# Patient Record
Sex: Male | Born: 1955
Health system: Southern US, Community
[De-identification: ages and names within clinical notes are randomized; demographics above are authoritative.]

## PROBLEM LIST (undated history)

## (undated) DIAGNOSIS — E119 Type 2 diabetes mellitus without complications: Secondary | ICD-10-CM

## (undated) HISTORY — DX: Type 2 diabetes mellitus without complications: E11.9

---
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
## 2014-05-03 ENCOUNTER — Telehealth: Payer: Self-pay | Admitting: Family

## 2014-05-03 ENCOUNTER — Encounter: Payer: Self-pay | Admitting: Family

## 2014-05-03 ENCOUNTER — Ambulatory Visit (INDEPENDENT_AMBULATORY_CARE_PROVIDER_SITE_OTHER): Payer: BLUE CROSS/BLUE SHIELD | Admitting: Family

## 2014-05-03 VITALS — BP 160/108 | HR 82 | Temp 97.6°F | Resp 16 | Ht 69.0 in | Wt 211.8 lb

## 2014-05-03 DIAGNOSIS — R635 Abnormal weight gain: Secondary | ICD-10-CM | POA: Insufficient documentation

## 2014-05-03 DIAGNOSIS — R9431 Abnormal electrocardiogram [ECG] [EKG]: Secondary | ICD-10-CM

## 2014-05-03 DIAGNOSIS — Z23 Encounter for immunization: Secondary | ICD-10-CM

## 2014-05-03 DIAGNOSIS — L719 Rosacea, unspecified: Secondary | ICD-10-CM | POA: Insufficient documentation

## 2014-05-03 DIAGNOSIS — Z0000 Encounter for general adult medical examination without abnormal findings: Secondary | ICD-10-CM

## 2014-05-03 DIAGNOSIS — I10 Essential (primary) hypertension: Secondary | ICD-10-CM | POA: Insufficient documentation

## 2014-05-03 LAB — CBC WITH DIFFERENTIAL/PLATELET
BASOS PCT: 0.4 % (ref 0.0–3.0)
Basophils Absolute: 0 10*3/uL (ref 0.0–0.1)
EOS ABS: 0.1 10*3/uL (ref 0.0–0.7)
EOS PCT: 1.7 % (ref 0.0–5.0)
HEMATOCRIT: 44.5 % (ref 39.0–52.0)
HEMOGLOBIN: 15.4 g/dL (ref 13.0–17.0)
Lymphocytes Relative: 23.3 % (ref 12.0–46.0)
Lymphs Abs: 1.7 10*3/uL (ref 0.7–4.0)
MCHC: 34.5 g/dL (ref 30.0–36.0)
MCV: 86.9 fl (ref 78.0–100.0)
MONO ABS: 0.7 10*3/uL (ref 0.1–1.0)
MONOS PCT: 9.5 % (ref 3.0–12.0)
NEUTROS ABS: 4.6 10*3/uL (ref 1.4–7.7)
Neutrophils Relative %: 65.1 % (ref 43.0–77.0)
PLATELETS: 302 10*3/uL (ref 150.0–400.0)
RBC: 5.12 Mil/uL (ref 4.22–5.81)
RDW: 13.6 % (ref 11.5–15.5)
WBC: 7.1 10*3/uL (ref 4.0–10.5)

## 2014-05-03 LAB — URINALYSIS, ROUTINE W REFLEX MICROSCOPIC
Ketones, ur: 15 — AB
LEUKOCYTES UA: NEGATIVE
NITRITE: NEGATIVE
Specific Gravity, Urine: >=1.03 — AB (ref 1.000–1.030)
TOTAL PROTEIN, URINE-UPE24: 100 — AB
UROBILINOGEN UA: 0.2 (ref 0.0–1.0)
Urine Glucose: 250 — AB
pH: 5.5 (ref 5.0–8.0)

## 2014-05-03 LAB — LIPID PANEL
CHOL/HDL RATIO: 4
CHOLESTEROL: 179 mg/dL (ref 0–200)
HDL: 45.5 mg/dL (ref >39.00)
LDL Cholesterol: 112 mg/dL — ABNORMAL HIGH (ref 0–99)
NONHDL: 133.5
Triglycerides: 107 mg/dL (ref 0.0–149.0)
VLDL: 21.4 mg/dL (ref 0.0–40.0)

## 2014-05-03 LAB — HEPATIC FUNCTION PANEL
ALBUMIN: 4.4 g/dL (ref 3.5–5.2)
ALT: 25 U/L (ref 0–53)
AST: 16 U/L (ref 0–37)
Alkaline Phosphatase: 125 U/L — ABNORMAL HIGH (ref 39–117)
BILIRUBIN DIRECT: 0.2 mg/dL (ref 0.0–0.3)
TOTAL PROTEIN: 7 g/dL (ref 6.0–8.3)
Total Bilirubin: 0.9 mg/dL (ref 0.2–1.2)

## 2014-05-03 LAB — BASIC METABOLIC PANEL
BUN: 20 mg/dL (ref 6–23)
CO2: 30 mEq/L (ref 19–32)
Calcium: 9.7 mg/dL (ref 8.4–10.5)
Chloride: 100 mEq/L (ref 96–112)
Creatinine, Ser: 0.95 mg/dL (ref 0.40–1.50)
GFR: 86.54 mL/min (ref >60.00)
Glucose, Bld: 230 mg/dL — ABNORMAL HIGH (ref 70–99)
Potassium: 4.2 mEq/L (ref 3.5–5.1)
SODIUM: 137 meq/L (ref 135–145)

## 2014-05-03 LAB — PSA: PSA: 3.36 ng/mL (ref 0.10–4.00)

## 2014-05-03 LAB — TSH: TSH: 1.35 u[IU]/mL (ref 0.35–4.50)

## 2014-05-03 MED ORDER — METRONIDAZOLE 0.75 % EX GEL: CUTANEOUS | Status: DC

## 2014-05-03 MED ORDER — LISINOPRIL-HYDROCHLOROTHIAZIDE 10-12.5 MG PO TABS: 1.0000 | ORAL_TABLET | Freq: Every day | ORAL | Status: DC

## 2014-05-03 NOTE — Assessment & Plan Note (Signed)
Spoke with Dr. Delton See, cardiologist, nonspecific changes. In setting of shortness of breath with exertion, she recommends stress test. Will order.

## 2014-05-03 NOTE — Assessment & Plan Note (Addendum)
New, trial of metronidazole gel.

## 2014-05-03 NOTE — Progress Notes (Signed)
Subjective:    Patient ID: Paul Yu, male    DOB: 02/06/1956, 59 y.o.   MRN: [id number]  HPI   Paul Yu is a 59 yr old male who presents today to establish care.    Reports that he went to [facility] and noted high reading. Has not had a primary care provider.  Reports some abdominal bloating- only occurs on days when he drinks sodas  Weight gain- reports that over the summer he had lost weight down to 200.  Immunizations: declines flu shot, tetanus Diet: reports diet is fair, eats fiber one bars/fruits. Exercise: no formal exercise, has a treadmill at home Colonoscopy: never   Review of Systems  Constitutional: Positive for unexpected weight change.  HENT: Positive for postnasal drip. Negative for hearing loss.   Eyes: Negative for visual disturbance.  Respiratory: Negative for cough.        Gets winded with activity- started when he gained weight  Cardiovascular: Negative for chest pain.  Gastrointestinal: Negative for diarrhea.       + bloating  Genitourinary: Negative for dysuria and frequency.  Musculoskeletal: Negative for myalgias.       Some joint pain in fingers  Skin:       Some rash on his face.    Neurological: Negative for headaches.  Psychiatric/Behavioral:       Denies depression/anxiety- but notes that he can lose his temper easily.    History reviewed. No pertinent past medical history.  History   Social History  . Marital Status: Married    Spouse Name: N/A    Number of Children: N/A  . Years of Education: N/A   Occupational History  . Not on file.   Social History Main Topics  . Smoking status: Never Smoker   . Smokeless tobacco: Never Used  . Alcohol Use: 0.0 oz/week    0 Not specified per week     Comment: rarely,   . Drug Use: No  . Sexual Activity: Not on file   Other Topics Concern  . Not on file   Social History Narrative   4th marriage-    Daughter born 2002   Son 1982-lives in [place]   Son 1985- in [place]   Drives a truck   Completed 9th grade- GED   Enjoys working on cars,         Past Surgical History  Procedure Laterality Date  . Tonsillectomy  1966    Family History  Problem Relation Age of Onset  . Cancer Mother     ?lung  . Diabetes Maternal Grandmother   . Heart disease Neg Hx     No Known Allergies  No current outpatient prescriptions on file prior to visit.   No current facility-administered medications on file prior to visit.    BP 160/108 mmHg  Pulse 82  Temp(Src) 97.6 F (36.4 C) (Oral)  Resp 16  Ht 5\' 9"  (1.753 m)  Wt 211 lb 12.8 oz (96.072 kg)  BMI 31.26 kg/m2  SpO2 98%        Objective:   Physical Exam  Constitutional: He is oriented to person, place, and time. He appears well-developed and well-nourished. No distress.  HENT:  Head: Normocephalic and atraumatic.  Right Ear: Tympanic membrane and ear canal normal.  Left Ear: Tympanic membrane and ear canal normal.  Eyes: No scleral icterus.  Cardiovascular: Normal rate and regular rhythm.   No murmur heard. Pulmonary/Chest: Effort normal and breath sounds normal. No respiratory distress.  He has no wheezes. He has no rales.  Abdominal: Soft. Bowel sounds are normal. He exhibits no distension. There is no tenderness. There is no rebound and no guarding.  Musculoskeletal: He exhibits no edema.  Lymphadenopathy:    He has no cervical adenopathy.  Neurological: He is alert and oriented to person, place, and time.  Skin: Skin is warm and dry.  + inflamed rash noted on cheeks/nose  Psychiatric: He has a normal mood and affect. His behavior is normal. Thought content normal.          Assessment & Plan:

## 2014-05-03 NOTE — Assessment & Plan Note (Signed)
Discussed healthy diet and weight loss.

## 2014-05-03 NOTE — Progress Notes (Signed)
Pre visit review using our clinic review tool, if applicable. No additional management support is needed unless otherwise documented below in the visit note. 

## 2014-05-03 NOTE — Assessment & Plan Note (Signed)
Tdap to day, fasting labs. PSA, refer for colo.

## 2014-05-03 NOTE — Telephone Encounter (Signed)
noted 

## 2014-05-03 NOTE — Telephone Encounter (Signed)
Please contact pt and let him know that I reviewed his EKG with the cardiologist and there are some non-specific changes.  Since he has some shortness of breath when he is working, I would like him to have a stress test to check his heart.

## 2014-05-03 NOTE — Telephone Encounter (Signed)
Notified pt and he voices understanding and is agreeable to proceed with referral. 

## 2014-05-03 NOTE — Telephone Encounter (Signed)
° °  Pt states he is off on Tues and Thursday for colonoscopy  referral

## 2014-05-03 NOTE — Patient Instructions (Signed)
Start lisinopril-hctz for blood pressure. Start metronidazole gel for rosacea on face. Complete lab work prior to leaving. You will be about contacted re: colonoscopy. Work on low sodium diet, exercise and weight loss. Follow up in 2 weeks.

## 2014-05-03 NOTE — Assessment & Plan Note (Signed)
New. Obtain bmet, start lisinopril hctz.

## 2014-05-05 ENCOUNTER — Telehealth: Payer: Self-pay | Admitting: Family

## 2014-05-05 MED ORDER — METFORMIN HCL 500 MG PO TABS: 500.0000 mg | ORAL_TABLET | Freq: Two times a day (BID) | ORAL | Status: DC

## 2014-05-05 NOTE — Telephone Encounter (Signed)
Spoke with patient and advised per provider recommendations. States understanding of instructions.   Scheduler to put appt in.

## 2014-05-05 NOTE — Telephone Encounter (Signed)
Please contact patient and let him know that his blood work shows that he is diabetic.  I would like him to schedule a 30 minute appointment with me, followed by a 15 minute nurse visit for meter teaching. Start metformin.

## 2014-05-10 ENCOUNTER — Ambulatory Visit: Payer: BLUE CROSS/BLUE SHIELD | Admitting: Family

## 2014-05-12 ENCOUNTER — Ambulatory Visit: Payer: BLUE CROSS/BLUE SHIELD

## 2014-05-12 ENCOUNTER — Ambulatory Visit: Payer: BLUE CROSS/BLUE SHIELD | Admitting: Family

## 2014-05-13 ENCOUNTER — Ambulatory Visit (HOSPITAL_COMMUNITY): Payer: BLUE CROSS/BLUE SHIELD | Attending: Cardiovascular Disease | Admitting: Radiology

## 2014-05-13 DIAGNOSIS — I10 Essential (primary) hypertension: Secondary | ICD-10-CM | POA: Insufficient documentation

## 2014-05-13 DIAGNOSIS — R9431 Abnormal electrocardiogram [ECG] [EKG]: Secondary | ICD-10-CM | POA: Insufficient documentation

## 2014-05-13 DIAGNOSIS — E119 Type 2 diabetes mellitus without complications: Secondary | ICD-10-CM | POA: Diagnosis not present

## 2014-05-13 DIAGNOSIS — R0609 Other forms of dyspnea: Secondary | ICD-10-CM | POA: Diagnosis not present

## 2014-05-13 MED ORDER — TECHNETIUM TC 99M SESTAMIBI GENERIC - CARDIOLITE
30.0000 | Freq: Once | INTRAVENOUS | Status: AC | PRN
  Administered 2014-05-13: 30 via INTRAVENOUS

## 2014-05-13 MED ORDER — TECHNETIUM TC 99M SESTAMIBI GENERIC - CARDIOLITE
10.0000 | Freq: Once | INTRAVENOUS | Status: AC | PRN
  Administered 2014-05-13: 10 via INTRAVENOUS

## 2014-05-13 NOTE — Progress Notes (Signed)
[facility] SITE 3 NUCLEAR MED [address] [place], [place] [address] [phone]    Cardiology Nuclear Med Study  Paul Yu is a 59 y.o. male     MRN : [id number]     DOB: December 25, 1955  Procedure Date: 05/13/2014  Nuclear Med Background Indication for Stress Test:  Evaluation for Ischemia and Abnormal EKG History:  No Cardiac History Cardiac Risk Factors: Hypertension and NIDDM  Symptoms:  DOE   Nuclear Pre-Procedure Caffeine/Decaff Intake:  None NPO After: 8:00pm   Lungs:  clear O2 Sat: 98% on room air. IV 0.9% NS with Angio Cath:  22g  IV Site: R Hand  IV Started by:  Bonnita Levan, RN  Chest Size (in):  44 Cup Size: n/a  Height:  (1.753 m)  Weight:  208 lb (94.348 kg)  BMI:  Body mass index is 30.7 kg/(m^2). Tech Comments:  N/A    Nuclear Med Study 1 or 2 day study: 1 day  Stress Test Type:  Stress  Reading MD: N/A  Order Authorizing Provider:  Danise Edge, MD  Resting Radionuclide: Technetium 25m Sestamibi  Resting Radionuclide Dose: 11.0 mCi   Stress Radionuclide:  Technetium 36m Sestamibi  Stress Radionuclide Dose: 33.0 mCi           Stress Protocol Rest HR: 69 Stress HR: 146  Rest BP: 177/95 Stress BP: 230/110  Exercise Time (min): 6.00 METS: 7.0   Predicted Max HR: 162 bpm % Max HR: 90.12 bpm Rate Pressure Product: 21308   Dose of Adenosine (mg):  n/a Dose of Lexiscan: n/a mg  Dose of Atropine (mg): n/a Dose of Dobutamine: n/a mcg/kg/min (at max HR)  Stress Test Technologist: Bonnita Levan, RN  Nuclear Technologist:  Kerby Nora, CNMT     Rest Procedure:  Myocardial perfusion imaging was performed at rest 45 minutes following the intravenous administration of Technetium 71m Sestamibi. Rest ECG: Normal sinus rhythm. LVH with ST changes  Stress Procedure:  The patient exercised on the treadmill utilizing the Bruce Protocol for 6:00 minutes. The patient stopped due to dyspnea and fatigue and denied any chest pain.  Technetium 34m  Sestamibi was injected at peak exercise and myocardial perfusion imaging was performed after a brief delay. Stress ECG: No significant change from baseline ECG  QPS Raw Data Images:  Normal; no motion artifact; normal heart/lung ratio. Stress Images:  Normal homogeneous uptake in all areas of the myocardium. Rest Images:  Normal homogeneous uptake in all areas of the myocardium. Subtraction (SDS):  No evidence of ischemia. Transient Ischemic Dilatation (Normal <1.22):  1.00 Lung/Heart Ratio (Normal <0.45):  0.32  Quantitative Gated Spect Images QGS EDV:  216 ml QGS ESV:  141 ml  Impression Exercise Capacity:  There is decreased exercise capacity BP Response:  There is a dramatically high blood pressure response, especially considering left ventricular dysfunction. Clinical Symptoms:  Shortness of breath with exercise and fatigue ECG Impression:  No significant ST segment change suggestive of ischemia. Comparison with Prior Nuclear Study: No images to compare  Overall Impression:  The study is abnormal. This is a high risk scan because of the low ejection fraction. There is no definite ischemia. The blood pressure response with exercise is very abnormal. Resting pressure was in the range of 170/90. As the patient stood began to walk pressure went to 176/111. At peak stress blood pressure was 240/129. It would seem that this patient's left ventricular dysfunction could be on the basis of hypertensive disease. Aggressive treatment  of rest and exercise blood pressure may be helpful.  LV Ejection Fraction: 35%.  LV Wall Motion:  There is global hypokinesis.  Jerral Bonito, MD

## 2014-05-17 ENCOUNTER — Encounter: Payer: Self-pay | Admitting: Family

## 2014-05-17 ENCOUNTER — Ambulatory Visit (INDEPENDENT_AMBULATORY_CARE_PROVIDER_SITE_OTHER): Payer: BLUE CROSS/BLUE SHIELD | Admitting: Family

## 2014-05-17 ENCOUNTER — Telehealth: Payer: Self-pay | Admitting: Family

## 2014-05-17 VITALS — BP 172/110 | HR 66 | Temp 98.0°F | Resp 18 | Ht 69.0 in | Wt 215.0 lb

## 2014-05-17 DIAGNOSIS — R9439 Abnormal result of other cardiovascular function study: Secondary | ICD-10-CM

## 2014-05-17 DIAGNOSIS — E119 Type 2 diabetes mellitus without complications: Secondary | ICD-10-CM

## 2014-05-17 DIAGNOSIS — E785 Hyperlipidemia, unspecified: Secondary | ICD-10-CM

## 2014-05-17 DIAGNOSIS — E1165 Type 2 diabetes mellitus with hyperglycemia: Secondary | ICD-10-CM

## 2014-05-17 DIAGNOSIS — IMO0002 Reserved for concepts with insufficient information to code with codable children: Secondary | ICD-10-CM

## 2014-05-17 DIAGNOSIS — I10 Essential (primary) hypertension: Secondary | ICD-10-CM

## 2014-05-17 MED ORDER — CLONIDINE HCL 0.1 MG PO TABS: 0.1000 mg | ORAL_TABLET | Freq: Three times a day (TID) | ORAL | Status: DC

## 2014-05-17 MED ORDER — ONETOUCH DELICA LANCETS FINE MISC: Status: DC

## 2014-05-17 MED ORDER — ASPIRIN EC 81 MG PO TBEC: 81.0000 mg | DELAYED_RELEASE_TABLET | Freq: Every day | ORAL | Status: AC

## 2014-05-17 MED ORDER — ATORVASTATIN CALCIUM 40 MG PO TABS: 40.0000 mg | ORAL_TABLET | Freq: Every day | ORAL | Status: DC

## 2014-05-17 MED ORDER — GLUCOSE BLOOD VI STRP: ORAL_STRIP | Status: DC

## 2014-05-17 NOTE — Telephone Encounter (Signed)
Please contact pt and let him know that I reviewed the stress test again and see that his ejection fraction (the amount of blood that his heart is pumping with each beat) is low. His is 35%, normal is 55-60%.  I would like for him to meet with cardiology for further evaluation and I have pended referral.

## 2014-05-17 NOTE — Patient Instructions (Addendum)
Start aspirin 81mg  and lipitor (atorvastatin) once daily for cardiac prevention.  Continue lisinopril hctz, start clonidine 0.1mg  3 times daily.  You will be contacted about your referral to podiatry. Continue to work on healthy diet (low sodium/diabetic diet), exercise and weight loss. Follow up in 1 week.   Diabetes Mellitus and Food It is important for you to manage your blood sugar (glucose) level. Your blood glucose level can be greatly affected by what you eat. Eating healthier foods in the appropriate amounts throughout the day at about the same time each day will help you control your blood glucose level. It can also help slow or prevent worsening of your diabetes mellitus. Healthy eating may even help you improve the level of your blood pressure and reach or maintain a healthy weight.  HOW CAN FOOD AFFECT ME? Carbohydrates Carbohydrates affect your blood glucose level more than any other type of food. Your dietitian will help you determine how many carbohydrates to eat at each meal and teach you how to count carbohydrates. Counting carbohydrates is important to keep your blood glucose at a healthy level, especially if you are using insulin or taking certain medicines for diabetes mellitus. Alcohol Alcohol can cause sudden decreases in blood glucose (hypoglycemia), especially if you use insulin or take certain medicines for diabetes mellitus. Hypoglycemia can be a life-threatening condition. Symptoms of hypoglycemia (sleepiness, dizziness, and disorientation) are similar to symptoms of having too much alcohol.  If your health care provider has given you approval to drink alcohol, do so in moderation and use the following guidelines:  Women should not have more than one drink per day, and men should not have more than two drinks per day. One drink is equal to:  12 oz of beer.  5 oz of wine.  1 oz of hard liquor.  Do not drink on an empty stomach.  Keep yourself hydrated. Have water,  diet soda, or unsweetened iced tea.  Regular soda, juice, and other mixers might contain a lot of carbohydrates and should be counted. WHAT FOODS ARE NOT RECOMMENDED? As you make food choices, it is important to remember that all foods are not the same. Some foods have fewer nutrients per serving than other foods, even though they might have the same number of calories or carbohydrates. It is difficult to get your body what it needs when you eat foods with fewer nutrients. Examples of foods that you should avoid that are high in calories and carbohydrates but low in nutrients include:  Trans fats (most processed foods list trans fats on the Nutrition Facts label).  Regular soda.  Juice.  Candy.  Sweets, such as cake, pie, doughnuts, and cookies.  Fried foods. WHAT FOODS CAN I EAT? Have nutrient-rich foods, which will nourish your body and keep you healthy. The food you should eat also will depend on several factors, including:  The calories you need.  The medicines you take.  Your weight.  Your blood glucose level.  Your blood pressure level.  Your cholesterol level. You also should eat a variety of foods, including:  Protein, such as meat, poultry, fish, tofu, nuts, and seeds (lean animal proteins are best).  Fruits.  Vegetables.  Dairy products, such as milk, cheese, and yogurt (low fat is best).  Breads, grains, pasta, cereal, rice, and beans.  Fats such as olive oil, trans fat-free margarine, canola oil, avocado, and olives. DOES EVERYONE WITH DIABETES MELLITUS HAVE THE SAME MEAL PLAN? Because every person with diabetes mellitus is  different, there is not one meal plan that works for everyone. It is very important that you meet with a dietitian who will help you create a meal plan that is just right for you. Document Released: 12/06/2004 Document Revised: 03/16/2013 Document Reviewed: 02/05/2013 [facility] Patient Information 2015 [place], [place]. This information is  not intended to replace advice given to you by your health care provider. Make sure you discuss any questions you have with your health care provider.

## 2014-05-17 NOTE — Progress Notes (Signed)
Subjective:    Patient ID: Paul Yu, male    DOB: May 24, 1955, 59 y.o.   MRN: [id number]  HPI  Patient here to discuss new diagnosis of DM2.  Last visit he was found to have sugar of 230 on routine lab draw.    The patient has eliminated sweet tea and soda. He will schedule eye exam.  No side effects from metformin.  HTN- He reports + compliance with bp med.   BP Readings from Last 3 Encounters:  05/17/14 170/106  05/13/14 177/95  05/03/14 160/108     Review of Systems See HPI   Past Medical History  Diagnosis Date  . Diabetes mellitus without complication     History   Social History  . Marital Status: Married    Spouse Name: N/A  . Number of Children: N/A  . Years of Education: N/A   Occupational History  . Not on file.   Social History Main Topics  . Smoking status: Never Smoker   . Smokeless tobacco: Never Used  . Alcohol Use: 0.0 oz/week    0 Standard drinks or equivalent per week     Comment: rarely,   . Drug Use: No  . Sexual Activity: Not on file   Other Topics Concern  . 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 Tonsillectomy  1966    Family History  Problem Relation Age of Onset  . Cancer Mother     ?lung  . Diabetes Maternal Grandmother   . Heart disease Neg Hx     No Known Allergies  Current Outpatient Prescriptions on File Prior to Visit  Medication Sig Dispense Refill  . metFORMIN (GLUCOPHAGE) 500 MG tablet Take 1 tablet (500 mg total) by mouth 2 (two) times daily with a meal. 60 tablet 2  . metroNIDAZOLE (METROGEL) 0.75 % gel Apply once daily to face 45 g 2   No current facility-administered medications on file prior to visit.    BP 172/110 mmHg  Pulse 66  Temp(Src) 98 F (36.7 C) (Oral)  Resp  18  Ht 5\' 9"  (1.753 m)  Wt 215 lb (97.523 kg)  BMI 31.74 kg/m2  SpO2 99%       Objective:    Physical Exam  Constitutional: He is oriented to person, place, and time. He appears well-developed and well-nourished. No distress.  HENT:  Head: Normocephalic and atraumatic.  Cardiovascular: Normal rate and regular rhythm.   No murmur heard. Pulmonary/Chest: Effort normal and breath sounds normal. No respiratory distress. He has no wheezes. He has no rales.  Musculoskeletal: He exhibits no edema.  Neurological: He is alert and oriented to person, place, and time.  Skin: Skin is warm and dry.  Psychiatric: He has a normal mood and affect. His behavior is normal. Thought content normal.    BP 170/106 mmHg  Pulse 66  Temp(Src) 98 F (36.7 C) (Oral)  Resp 18  Ht 5\' 9"  (1.753 m)  Wt 215 lb (97.523 kg)  BMI 31.74 kg/m2  SpO2 99% Wt Readings from Last 3 Encounters:  05/17/14 215 lb (97.523 kg)  05/13/14 208 lb (94.348 kg)  05/03/14 211 lb 12.8 oz (96.072  kg)     Lab Results  Component Value Date   WBC 7.1 05/03/2014   HGB 15.4 05/03/2014   HCT 44.5 05/03/2014   PLT 302.0 05/03/2014   GLUCOSE 230* 05/03/2014   CHOL 179 05/03/2014   TRIG 107.0 05/03/2014   HDL 45.50 05/03/2014   LDLCALC 112* 05/03/2014   ALT 25 05/03/2014   AST 16 05/03/2014   NA 137 05/03/2014   K 4.2 05/03/2014   CL 100 05/03/2014   CREATININE 0.95 05/03/2014   BUN 20 05/03/2014   CO2 30 05/03/2014   TSH 1.35 05/03/2014   PSA 3.36 05/03/2014       Assessment & Plan:

## 2014-05-17 NOTE — Progress Notes (Signed)
Pt education provided on how to utilize the OneTouch Verio glucose meter.  Demonstration provided.  Pt was able to successfully provide a return demonstration with little coaching.  Rx sent to pharmacy for additional strips and lancets.  Pt is aware.

## 2014-05-18 DIAGNOSIS — E785 Hyperlipidemia, unspecified: Secondary | ICD-10-CM | POA: Insufficient documentation

## 2014-05-18 DIAGNOSIS — E119 Type 2 diabetes mellitus without complications: Secondary | ICD-10-CM | POA: Insufficient documentation

## 2014-05-18 DIAGNOSIS — R9439 Abnormal result of other cardiovascular function study: Secondary | ICD-10-CM | POA: Insufficient documentation

## 2014-05-18 NOTE — Telephone Encounter (Signed)
Notified pt and he is agreeable to proceed with referral. 

## 2014-05-18 NOTE — Assessment & Plan Note (Signed)
Uncontrolled. Continue zestoretic, add clonidine.  A dose was given in the office and BP improved.

## 2014-05-18 NOTE — Assessment & Plan Note (Signed)
Lab Results  Component Value Date   CHOL 179 05/03/2014   HDL 45.50 05/03/2014   LDLCALC 112* 05/03/2014   TRIG 107.0 05/03/2014   CHOLHDL 4 05/03/2014   LDL above goal. Add statin for cardiac prevention.

## 2014-05-18 NOTE — Assessment & Plan Note (Addendum)
Addendum:  Noted to decreased LVEF (35%) on myoview.  Will refer to cardiology for further evaluation. See phone note.

## 2014-05-18 NOTE — Assessment & Plan Note (Signed)
New.  Discussed diabetic diet, exercise, weight loss.  Now on metformin, baby aspirin for cardiac prevention. Obtain A1C,urine microalbumin.  Needs pneumovax.

## 2014-05-24 ENCOUNTER — Encounter: Payer: Self-pay | Admitting: Family

## 2014-05-24 ENCOUNTER — Ambulatory Visit (INDEPENDENT_AMBULATORY_CARE_PROVIDER_SITE_OTHER): Payer: BLUE CROSS/BLUE SHIELD | Admitting: Family

## 2014-05-24 VITALS — BP 160/94 | HR 66 | Temp 97.6°F | Resp 16 | Ht 69.0 in | Wt 213.4 lb

## 2014-05-24 DIAGNOSIS — I10 Essential (primary) hypertension: Secondary | ICD-10-CM

## 2014-05-24 DIAGNOSIS — R9439 Abnormal result of other cardiovascular function study: Secondary | ICD-10-CM

## 2014-05-24 DIAGNOSIS — E1165 Type 2 diabetes mellitus with hyperglycemia: Secondary | ICD-10-CM

## 2014-05-24 DIAGNOSIS — IMO0002 Reserved for concepts with insufficient information to code with codable children: Secondary | ICD-10-CM

## 2014-05-24 DIAGNOSIS — E785 Hyperlipidemia, unspecified: Secondary | ICD-10-CM

## 2014-05-24 DIAGNOSIS — E119 Type 2 diabetes mellitus without complications: Secondary | ICD-10-CM

## 2014-05-24 LAB — BASIC METABOLIC PANEL
BUN: 15 mg/dL (ref 6–23)
CALCIUM: 9 mg/dL (ref 8.4–10.5)
CO2: 30 meq/L (ref 19–32)
CREATININE: 0.91 mg/dL (ref 0.40–1.50)
Chloride: 101 mEq/L (ref 96–112)
GFR: 90.92 mL/min (ref >60.00)
Glucose, Bld: 178 mg/dL — ABNORMAL HIGH (ref 70–99)
Potassium: 4.2 mEq/L (ref 3.5–5.1)
Sodium: 137 mEq/L (ref 135–145)

## 2014-05-24 LAB — MICROALBUMIN / CREATININE URINE RATIO
Creatinine,U: 249.4 mg/dL
MICROALB/CREAT RATIO: 5.5 mg/g (ref 0.0–30.0)
Microalb, Ur: 13.6 mg/dL — ABNORMAL HIGH (ref 0.0–1.9)

## 2014-05-24 LAB — HEMOGLOBIN A1C: HEMOGLOBIN A1C: 9.3 % — AB (ref 4.6–6.5)

## 2014-05-24 MED ORDER — LISINOPRIL-HYDROCHLOROTHIAZIDE 20-25 MG PO TABS: 1.0000 | ORAL_TABLET | Freq: Every day | ORAL | Status: DC

## 2014-05-24 NOTE — Patient Instructions (Signed)
Complete lab work prior to leaving. Continue clonidine 3x daily. Increase lisinopril hctz to 20/25mg  once daily. Continue to work on [employer], exercise, weight loss. Please consider starting atorvastatin and aspirin to decrease your risk of heart attack and stroke.  Follow up in 1 month.

## 2014-05-24 NOTE — Progress Notes (Signed)
Pre visit review using our clinic review tool, if applicable. No additional management support is needed unless otherwise documented below in the visit note. 

## 2014-05-24 NOTE — Assessment & Plan Note (Signed)
Cardiology consult is pending.

## 2014-05-24 NOTE — Progress Notes (Signed)
Subjective:    Patient ID: Paul Yu, male    DOB: 11-28-1955, 59 y.o.   MRN: [id number]  HPI  [name] is a 59 yr old male who presents today for follow up of his blood pressure.  Last visit clonidine was added to his regimen.  Trying to make improvements to his diet.  He is continued on lisinopril-hctz.   BP Readings from Last 3 Encounters:  05/24/14 160/94  05/17/14 172/110  05/13/14 177/95   DM2- reports mild diarrhea initially with metformin but this has improved.  He reports fasting sugars around 150.    He is not currently taking statin or aspirin, concerned about possible side effects  Has upcoming cardiology visit to assess decreased LVEF noted on myoview.  Review of Systems See HPI  Past Medical History  Diagnosis Date  . Diabetes mellitus without complication     History   Social History  . Marital Status: Married    Spouse Name: N/A  . Number of Children: N/A  . Years of Education: N/A   Occupational History  . Not on file.   Social History Main Topics  . Smoking status: Never Smoker   . Smokeless tobacco: Never Used  . Alcohol Use: 0.0 oz/week    0 Standard drinks or equivalent per week     Comment: rarely,   . Drug Use: No  . Sexual Activity: Not on file   Other Topics Concern  . 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 Tonsillectomy  1966    Family History  Problem Relation Age of Onset  . Cancer Mother     ?lung  . Diabetes Maternal Grandmother   . Heart disease Neg Hx     No Known Allergies  Current Outpatient Prescriptions on File Prior to Visit  Medication Sig Dispense Refill  . cloNIDine (CATAPRES) 0.1 MG tablet Take 1 tablet (0.1 mg total) by mouth 3 (three) times daily. 90 tablet 0  . glucose  blood (ONETOUCH VERIO) test strip Check blood sugar every day and as needed. 100 each 12  . lisinopril-hydrochlorothiazide (PRINZIDE,ZESTORETIC) 10-12.5 MG per tablet Take 1 tablet by mouth daily.    . metFORMIN (GLUCOPHAGE) 500 MG tablet Take 1 tablet (500 mg total) by mouth 2 (two) times daily with a meal. 60 tablet 2  . metroNIDAZOLE (METROGEL) 0.75 % gel Apply once daily to face 45 g 2  . ONETOUCH DELICA LANCETS FINE MISC Check blood sugar every day and as needed 100 each 12  . aspirin EC 81 MG tablet Take 1 tablet (81 mg total) by mouth daily. (Patient not taking: Reported on 05/24/2014)    . atorvastatin (LIPITOR) 40 MG tablet Take 1 tablet (40 mg total) by mouth daily. (Patient not taking: Reported on 05/24/2014) 30 tablet 2   No current facility-administered medications on file prior to visit.    BP 160/94 mmHg  Pulse 66  Temp(Src) 97.6 F (36.4 C) (Oral)  Resp 16  Ht 5\' 9"  (1.753 m)  Wt 213 lb 6.4 oz (96.798 kg)  BMI 31.50 kg/m2  SpO2 99%       Objective:   Physical Exam  Constitutional:  He is oriented to person, place, and time. He appears well-developed and well-nourished. No distress.  HENT:  Head: Normocephalic and atraumatic.  Cardiovascular: Normal rate and regular rhythm.   No murmur heard. Pulmonary/Chest: Effort normal and breath sounds normal. No respiratory distress. He has no wheezes. He has no rales.  Musculoskeletal: He exhibits no edema.  Neurological: He is alert and oriented to person, place, and time.  Skin: Skin is warm and dry.  Psychiatric: He has a normal mood and affect. His behavior is normal. Thought content normal.          Assessment & Plan:

## 2014-05-24 NOTE — Assessment & Plan Note (Signed)
Improving but still above goal. Continue clonidine, increase lisinopril hctz from 10-12.5 to 20- 25.

## 2014-05-24 NOTE — Assessment & Plan Note (Signed)
He is improving diet.  I encouraged him to continue metformin, dietary changes, exercise, weight loss efforts. Complete lab work today as ordered last visit.

## 2014-05-24 NOTE — Assessment & Plan Note (Signed)
We discussed his calculated CVD risk over next 10 years (20%). Advised pt that benefits of aspirin/statin outweigh risks in his case.  Encouraged him to begin these meds.

## 2014-05-25 ENCOUNTER — Telehealth: Payer: Self-pay | Admitting: *Deleted

## 2014-05-25 NOTE — Telephone Encounter (Signed)
Prior authorization for Verio test strips initiated. Awaiting determination. JG//CMA 

## 2014-05-27 ENCOUNTER — Encounter: Payer: Self-pay | Admitting: Family

## 2014-06-08 NOTE — Telephone Encounter (Signed)
Do we have any of the glucose meter samples listed below.  If so, please send rx for matching strips test once daily #100 with 23 refills.

## 2014-06-08 NOTE — Telephone Encounter (Signed)
PA denied. Covered alternatives are Accu-Chek (Aviva, Aviva Plus, Compact, [employer], [address] and [employer] (Contour, Contour Next, [place] [address]). Please advise. JG//CMA

## 2014-06-09 MED ORDER — ACCU-CHEK AVIVA PLUS W/DEVICE KIT: PACK | Status: DC

## 2014-06-09 MED ORDER — GLUCOSE BLOOD VI STRP: ORAL_STRIP | Status: DC

## 2014-06-09 NOTE — Addendum Note (Signed)
Addended by: Amado Coe A on: 06/09/2014 10:39 AM   Modules accepted: Orders, Medications

## 2014-06-09 NOTE — Telephone Encounter (Signed)
Could you pls send rx for accucheck Aviva and strip to pharmacy?

## 2014-06-09 NOTE — Telephone Encounter (Signed)
accuchek aviva kit and strips escribed to pharmacy. JG//CMA

## 2014-06-09 NOTE — Telephone Encounter (Signed)
The only ones we have here is One Touch and Verio which are not covered.

## 2014-06-21 ENCOUNTER — Other Ambulatory Visit: Payer: Self-pay | Admitting: Family

## 2014-06-21 MED ORDER — CLONIDINE HCL 0.1 MG PO TABS: 0.1000 mg | ORAL_TABLET | Freq: Three times a day (TID) | ORAL | Status: DC

## 2014-06-21 NOTE — Telephone Encounter (Signed)
Called the patient informed sent in a 30 day supply for clonidine #90 to [facility] .

## 2014-06-21 NOTE — Telephone Encounter (Signed)
Caller name:Gemma, Greggory Stallion Relation to EH:UDJS Call back number:[phone] Pharmacy:[facility]        Reason for call: pt states is is completely out of his bp meds rx states Melissa gave him a 90 day supply however he takes 3 times daily and she did not provide any refills.  cloNIDine (CATAPRES) 0.1 MG tablet

## 2014-06-28 ENCOUNTER — Ambulatory Visit (INDEPENDENT_AMBULATORY_CARE_PROVIDER_SITE_OTHER): Payer: BLUE CROSS/BLUE SHIELD | Admitting: Family

## 2014-06-28 ENCOUNTER — Encounter: Payer: Self-pay | Admitting: Family

## 2014-06-28 VITALS — BP 148/98 | HR 71 | Temp 97.4°F | Resp 16 | Ht 69.0 in | Wt 209.0 lb

## 2014-06-28 DIAGNOSIS — E119 Type 2 diabetes mellitus without complications: Secondary | ICD-10-CM

## 2014-06-28 DIAGNOSIS — I10 Essential (primary) hypertension: Secondary | ICD-10-CM

## 2014-06-28 DIAGNOSIS — E785 Hyperlipidemia, unspecified: Secondary | ICD-10-CM

## 2014-06-28 LAB — BASIC METABOLIC PANEL
BUN: 19 mg/dL (ref 6–23)
CALCIUM: 9.7 mg/dL (ref 8.4–10.5)
CO2: 31 mEq/L (ref 19–32)
Chloride: 98 mEq/L (ref 96–112)
Creatinine, Ser: 0.94 mg/dL (ref 0.40–1.50)
GFR: 87.55 mL/min (ref >60.00)
GLUCOSE: 189 mg/dL — AB (ref 70–99)
Potassium: 3.7 mEq/L (ref 3.5–5.1)
Sodium: 135 mEq/L (ref 135–145)

## 2014-06-28 MED ORDER — AMLODIPINE BESYLATE 5 MG PO TABS: 5.0000 mg | ORAL_TABLET | Freq: Every day | ORAL | Status: DC

## 2014-06-28 MED ORDER — METFORMIN HCL 1000 MG PO TABS: 1000.0000 mg | ORAL_TABLET | Freq: Two times a day (BID) | ORAL | Status: DC

## 2014-06-28 NOTE — Progress Notes (Signed)
Pre visit review using our clinic review tool, if applicable. No additional management support is needed unless otherwise documented below in the visit note. 

## 2014-06-28 NOTE — Patient Instructions (Addendum)
Please complete lab work prior to leaving.  Increase metformin to 1000mg  twice daily.  Add amlodipine 5mg  once daily for blood pressure.  Follow up in 2 weeks for a nurse visit bp check and mid June for office visit.

## 2014-06-28 NOTE — Assessment & Plan Note (Signed)
BP improved but still above goal. Add amlodipine, continue lisinopril/hctz

## 2014-06-28 NOTE — Progress Notes (Signed)
Subjective:    Patient ID: Paul Yu, male    DOB: [birth year], 59 y.o.   MRN: [id number]  HPI    HTN- Patient is currently maintained on the following medications for blood pressure: lisinopril/hctz Patient reports good compliance with blood pressure medications. Patient denies chest pain, shortness of breath or swelling. Last 3 blood pressure readings in our office are as follows:  BP Readings from Last 3 Encounters:  06/28/14 148/98  05/24/14 160/94  05/17/14 172/110    DM2- has cut back on sweets. On metformin. Lab Results  Component Value Date   HGBA1C 9.3* 05/24/2014   Reports sugars 150-225 mid day.  He does not check fasting.      Patient is currently maintained on the following medication for hyperlipidemia: atorvastatin, reports + compliance Last lipid panel as follows:  Lab Results  Component Value Date   CHOL 179 05/03/2014   HDL 45.50 05/03/2014   LDLCALC 112* 05/03/2014   TRIG 107.0 05/03/2014   CHOLHDL 4 05/03/2014    Patient denies myalgia.   Review of Systems    see HPI  Past Medical History  Diagnosis Date  . Diabetes mellitus without complication     History   Social History  . Marital Status: Married    Spouse Name: N/A  . Number of Children: N/A  . Years of Education: N/A   Occupational History  . Not on file.   Social History Main Topics  . Smoking status: Never Smoker   . Smokeless tobacco: Never Used  . Alcohol Use: 0.0 oz/week    0 Standard drinks or equivalent per week     Comment: rarely,   . Drug Use: No  . Sexual Activity: Not on file   Other Topics Concern  . Not on file   Social History Narrative   4th marriage-    Daughter born 2002   Son 1982-lives in [place]- in [place] a truck   Completed 9th grade- GED   Enjoys working on cars,         Past Surgical History  Procedure Laterality Date  . Tonsillectomy  1966    Family History  Problem Relation Age of Onset  .  Cancer Mother     ?lung  . Diabetes Maternal Grandmother   . Heart disease Neg Hx     No Known Allergies  Current Outpatient Prescriptions on File Prior to Visit  Medication Sig Dispense Refill  . aspirin EC 81 MG tablet Take 1 tablet (81 mg total) by mouth daily.    Marland Kitchen atorvastatin (LIPITOR) 40 MG tablet Take 1 tablet (40 mg total) by mouth daily. 30 tablet 2  . Blood Glucose Monitoring Suppl (ACCU-CHEK AVIVA PLUS) W/DEVICE KIT Test blood sugar once daily. Dx Code: E11.9 1 kit 1  . cloNIDine (CATAPRES) 0.1 MG tablet Take 1 tablet (0.1 mg total) by mouth 3 (three) times daily. 90 tablet 0  . glucose blood (ACCU-CHEK AVIVA PLUS) test strip Test blood sugar once daily. Dx Code: E11.9 100 each 23  . lisinopril-hydrochlorothiazide (PRINZIDE,ZESTORETIC) 20-25 MG per tablet Take 1 tablet by mouth daily. 30 tablet 1  . metroNIDAZOLE (METROGEL) 0.75 % gel Apply once daily to face 45 g 2   No current facility-administered medications on file prior to visit.    BP 148/98 mmHg  Pulse 71  Temp(Src) 97.4 F (36.3 C) (Oral)  Resp 16  Ht 5' 9"  (1.753 m)  Wt 209 lb (94.802  kg)  BMI 30.85 kg/m2  SpO2 99%    Objective:   Physical Exam  Constitutional: He is oriented to person, place, and time. He appears well-developed and well-nourished. No distress.  HENT:  Head: Normocephalic and atraumatic.  Cardiovascular: Normal rate and regular rhythm.   No murmur heard. Pulmonary/Chest: Effort normal and breath sounds normal. No respiratory distress. He has no wheezes. He has no rales.  Musculoskeletal: He exhibits no edema.  Neurological: He is alert and oriented to person, place, and time.  Skin: Skin is warm and dry.  Psychiatric: He has a normal mood and affect. His behavior is normal. Thought content normal.          Assessment & Plan:

## 2014-06-28 NOTE — Assessment & Plan Note (Addendum)
Continue statin, plan flp next visit.

## 2014-06-28 NOTE — Assessment & Plan Note (Signed)
Based on home readings, will increase metformin from 500mg  bid to 1000mg  bid.

## 2014-07-05 ENCOUNTER — Ambulatory Visit (INDEPENDENT_AMBULATORY_CARE_PROVIDER_SITE_OTHER): Payer: BLUE CROSS/BLUE SHIELD | Admitting: Cardiology

## 2014-07-05 ENCOUNTER — Telehealth: Payer: Self-pay | Admitting: *Deleted

## 2014-07-05 ENCOUNTER — Encounter: Payer: Self-pay | Admitting: Cardiology

## 2014-07-05 VITALS — BP 150/90 | HR 62 | Ht 70.0 in | Wt 210.8 lb

## 2014-07-05 DIAGNOSIS — R9439 Abnormal result of other cardiovascular function study: Secondary | ICD-10-CM

## 2014-07-05 DIAGNOSIS — Z01812 Encounter for preprocedural laboratory examination: Secondary | ICD-10-CM

## 2014-07-05 DIAGNOSIS — I420 Dilated cardiomyopathy: Secondary | ICD-10-CM | POA: Diagnosis not present

## 2014-07-05 DIAGNOSIS — I10 Essential (primary) hypertension: Secondary | ICD-10-CM | POA: Diagnosis not present

## 2014-07-05 LAB — BASIC METABOLIC PANEL
BUN: 22 mg/dL (ref 6–23)
CHLORIDE: 99 meq/L (ref 96–112)
CO2: 31 mEq/L (ref 19–32)
Calcium: 10.1 mg/dL (ref 8.4–10.5)
Creatinine, Ser: 0.94 mg/dL (ref 0.40–1.50)
GFR: 87.55 mL/min (ref >60.00)
GLUCOSE: 210 mg/dL — AB (ref 70–99)
POTASSIUM: 4.1 meq/L (ref 3.5–5.1)
SODIUM: 136 meq/L (ref 135–145)

## 2014-07-05 LAB — CBC WITH DIFFERENTIAL/PLATELET
BASOS ABS: 0 10*3/uL (ref 0.0–0.1)
Basophils Relative: 0.4 % (ref 0.0–3.0)
EOS ABS: 0.2 10*3/uL (ref 0.0–0.7)
Eosinophils Relative: 2.5 % (ref 0.0–5.0)
HCT: 43.1 % (ref 39.0–52.0)
HEMOGLOBIN: 14.8 g/dL (ref 13.0–17.0)
LYMPHS PCT: 22.2 % (ref 12.0–46.0)
Lymphs Abs: 2.2 10*3/uL (ref 0.7–4.0)
MCHC: 34.4 g/dL (ref 30.0–36.0)
MCV: 87.7 fl (ref 78.0–100.0)
MONOS PCT: 8 % (ref 3.0–12.0)
Monocytes Absolute: 0.8 10*3/uL (ref 0.1–1.0)
NEUTROS PCT: 66.9 % (ref 43.0–77.0)
Neutro Abs: 6.5 10*3/uL (ref 1.4–7.7)
Platelets: 350 10*3/uL (ref 150.0–400.0)
RBC: 4.91 Mil/uL (ref 4.22–5.81)
RDW: 13.5 % (ref 11.5–15.5)
WBC: 9.7 10*3/uL (ref 4.0–10.5)

## 2014-07-05 LAB — APTT: aPTT: 33 s — ABNORMAL HIGH (ref 23.4–32.7)

## 2014-07-05 LAB — PROTIME-INR
INR: 1 ratio (ref 0.8–1.0)
Prothrombin Time: 11.4 s (ref 9.6–13.1)

## 2014-07-05 MED ORDER — CARVEDILOL 6.25 MG PO TABS: 6.2500 mg | ORAL_TABLET | Freq: Two times a day (BID) | ORAL | Status: DC

## 2014-07-05 NOTE — Patient Instructions (Signed)
Medication Instructions:  Your physician has recommended you make the following change in your medication:  1) START COREG 6.25 TWICE DAILY  Labwork: Your physician recommends that you have lab work TODAY (BMET, CBC, PT, INR).  Testing/Procedures: Your physician has requested that you have a left cardiac catheterization. Cardiac catheterization is used to diagnose and/or treat various heart conditions. Doctors may recommend this procedure for a number of different reasons. The most common reason is to evaluate chest pain. Chest pain can be a symptom of coronary artery disease (CAD), and cardiac catheterization can show whether plaque is narrowing or blocking your heart's arteries. This procedure is also used to evaluate the valves, as well as measure the blood flow and oxygen levels in different parts of your heart. For further information please visit [email or web address] Please follow instruction sheet, as given.  Follow-Up: Please call us after your blood pressure check next Tuesday for an update.  Your physician recommends that you schedule a follow-up appointment in: 2 months with Dr. Mayford Knife.  Any Other Special Instructions Will Be Listed Below (If Applicable).

## 2014-07-05 NOTE — Progress Notes (Addendum)
  Cardiology Office Note   Date:  07/05/2014   ID:  Paul Yu, DOB 12/17/1955, MRN [id number]  PCP:  O'SULLIVAN,MELISSA S., NP    Chief Complaint  Patient presents with  . Cardiomyopathy  . Hypertension      History of Present Illness: Paul Yu is a 59 y.o. male who presents for evaluation of abnormal stress test.  He has a history of HTN, DM and dyslipidemia and had a nuclear stress test done for risk stratification.  This showed normal perfusion but a reduced EF.  He denies any exertional chest pain or pressure.  He will have a bloated feeling in his upper abdomen after eating a large meal.  He occasionally has some SOB when over exerting himself.  He rarely feels an extra heart beat.  He denies any dizziness, LE edema or syncope.      Past Medical History  Diagnosis Date  . Diabetes mellitus without complication     Past Surgical History  Procedure Laterality Date  . Tonsillectomy  1966     Current Outpatient Prescriptions  Medication Sig Dispense Refill  . amLODipine (NORVASC) 5 MG tablet Take 1 tablet (5 mg total) by mouth daily. 30 tablet 3  . aspirin EC 81 MG tablet Take 1 tablet (81 mg total) by mouth daily.    . atorvastatin (LIPITOR) 40 MG tablet Take 1 tablet (40 mg total) by mouth daily. 30 tablet 2  . Blood Glucose Monitoring Suppl (ACCU-CHEK AVIVA PLUS) W/DEVICE KIT Test blood sugar once daily. Dx Code: E11.9 1 kit 1  . cloNIDine (CATAPRES) 0.1 MG tablet Take 1 tablet (0.1 mg total) by mouth 3 (three) times daily. 90 tablet 0  . glucose blood (ACCU-CHEK AVIVA PLUS) test strip Test blood sugar once daily. Dx Code: E11.9 100 each 23  . lisinopril-hydrochlorothiazide (PRINZIDE,ZESTORETIC) 20-25 MG per tablet Take 1 tablet by mouth daily. 30 tablet 1  . metFORMIN (GLUCOPHAGE) 1000 MG tablet Take 1 tablet (1,000 mg total) by mouth 2 (two) times daily with a meal. 60 tablet 5  . metroNIDAZOLE (METROGEL) 0.75 % gel Apply once daily to face 45 g 2  . ONETOUCH  DELICA LANCETS FINE MISC Use to check blood sugar once a day  11  . carvedilol (COREG) 6.25 MG tablet Take 1 tablet (6.25 mg total) by mouth 2 (two) times daily. 180 tablet 3   No current facility-administered medications for this visit.    Allergies:   Review of patient's allergies indicates no known allergies.    Social History:  The patient  reports that he has never smoked. He has never used smokeless tobacco. He reports that he drinks alcohol. He reports that he does not use illicit drugs.   Family History:  The patient's family history includes Cancer in his mother; Diabetes in his maternal grandmother. There is no history of Heart disease.    ROS:  Please see the history of present illness.   Otherwise, review of systems are positive for none.   All other systems are reviewed and negative.    PHYSICAL EXAM: VS:  BP 150/90 mmHg  Pulse 62  Ht 5' 10" (1.778 m)  Wt 210 lb 12.8 oz (95.618 kg)  BMI 30.25 kg/m2  SpO2 99% , BMI Body mass index is 30.25 kg/(m^2). GEN: Well nourished, well developed, in no acute distress HEENT: normal Neck: no JVD, carotid bruits, or masses Cardiac: RRR; no murmurs, rubs, or gallops,no edema  Respiratory:  clear to auscultation bilaterally, normal   work of breathing GI: soft, nontender, nondistended, + BS MS: no deformity or atrophy Skin: warm and dry, no rash Neuro:  Strength and sensation are intact Psych: euthymic mood, full affect   EKG:  EKG is not ordered today.    Recent Labs: 05/03/2014: ALT 25; Hemoglobin 15.4; Platelets 302.0; TSH 1.35 06/28/2014: BUN 19; Creatinine 0.94; Potassium 3.7; Sodium 135    Lipid Panel    Component Value Date/Time   CHOL 179 05/03/2014 0941   TRIG 107.0 05/03/2014 0941   HDL 45.50 05/03/2014 0941   CHOLHDL 4 05/03/2014 0941   VLDL 21.4 05/03/2014 0941   LDLCALC 112* 05/03/2014 0941      Wt Readings from Last 3 Encounters:  07/05/14 210 lb 12.8 oz (95.618 kg)  06/28/14 209 lb (94.802 kg)    05/24/14 213 lb 6.4 oz (96.798 kg)     Reviewed results of nuclear stress test at length with patient   ASSESSMENT AND PLAN:  1.  Abnormal  High risk nuclear stress test with no inducible ischemia but reduced EF.  This is most likely secondary to hypertensive DCM but he does have multiple cardiac risk factors including HTN, DM and dyslipidemia.  He is asymptomatic from a cardiac standpoint.   2.  Moderate LV dysfunction with EF 35%.  This is most likely secondary to hypertensive DCM.  He needs aggressive treatment for HTN.  I will add Coreg 6.25mg BID for better BP control and for LV dysfunction.  He will continue on ACE I.  I have recommended that we proceed with left heart cath to make sure he does not have CAD.  Cardiac catheterization was discussed with the patient fully including risks on myocardial infarction, death, stroke, bleeding, arrhythmia, dye allergy, renal insufficiency or bleeding.  All patient questions and concerns were discussed and the patient understands and is willing to proceed.   3.  HTN - still elevated - add Coreg as above    Current medicines are reviewed at length with the patient today.  The patient does not have concerns regarding medicines.  The following changes have been made:  Added Coreg 6.25mg BID  Labs/ tests ordered today include: see above assessment and plan  Orders Placed This Encounter  Procedures  . Basic Metabolic Panel (BMET)  . CBC w/Diff  . PTT  . INR/PT     Disposition:   FU with nurse in 1 week for BP check and with me in 2 months   Signed, Jerrilynn Mikowski R, MD  07/05/2014 10:45 AM    [facility] [address], [place], [place]  [address] Phone: [phone]; Fax: [phone]    

## 2014-07-05 NOTE — Telephone Encounter (Signed)
pt notified about lab results with verbal understanding  

## 2014-07-12 ENCOUNTER — Telehealth: Payer: Self-pay | Admitting: Cardiology

## 2014-07-12 ENCOUNTER — Telehealth: Payer: Self-pay | Admitting: *Deleted

## 2014-07-12 ENCOUNTER — Ambulatory Visit (INDEPENDENT_AMBULATORY_CARE_PROVIDER_SITE_OTHER): Payer: BLUE CROSS/BLUE SHIELD | Admitting: *Deleted

## 2014-07-12 VITALS — BP 164/84 | HR 61

## 2014-07-12 DIAGNOSIS — I10 Essential (primary) hypertension: Secondary | ICD-10-CM

## 2014-07-12 MED ORDER — AMLODIPINE BESYLATE 10 MG PO TABS: 10.0000 mg | ORAL_TABLET | Freq: Every day | ORAL | Status: DC

## 2014-07-12 NOTE — Telephone Encounter (Signed)
New Message    Patient is calling to give his BP reading per his PCP Dr. Lendell Caprice @ [facility].  160/80.

## 2014-07-12 NOTE — Telephone Encounter (Signed)
FYI Patient's BP at nurse visit was 164/84 with HR 61.  Patient has been checking at home and states it has been higher- 190s/180s systolic.  He states he has an appointment with cardiology this week as well for a BP check before his heart cath.

## 2014-07-12 NOTE — Telephone Encounter (Signed)
Patient st Dr. Lendell Caprice increased amlodipine from 5 to 10 mg daily.  Instructed patient to check BP daily for a week and call with results. Patient agrees with treatment plan.

## 2014-07-12 NOTE — Telephone Encounter (Signed)
Notified patient who stated understanding.   

## 2014-07-12 NOTE — Telephone Encounter (Signed)
BP Readings from Last 3 Encounters:  07/12/14 164/84  07/05/14 150/90  06/28/14 148/98   I would like him to increase amlodipine to 10mg  once daily, follow up in 1 month for office visit.

## 2014-07-12 NOTE — Progress Notes (Signed)
Pre visit review using our clinic review tool, if applicable. No additional management support is needed unless otherwise documented below in the visit note. 

## 2014-07-15 ENCOUNTER — Encounter (HOSPITAL_COMMUNITY)
Admission: RE | Disposition: A | Discharge status: Home / Self Care | Payer: Self-pay | Source: Ambulatory Visit | Attending: Cardiovascular Disease

## 2014-07-15 ENCOUNTER — Encounter (HOSPITAL_COMMUNITY): Payer: Self-pay | Admitting: Cardiovascular Disease

## 2014-07-15 ENCOUNTER — Ambulatory Visit (HOSPITAL_COMMUNITY)
Admission: RE | Admit: 2014-07-15 | Discharge: 2014-07-15 | Disposition: A | Discharge status: Home / Self Care | Payer: BLUE CROSS/BLUE SHIELD | Source: Ambulatory Visit | Attending: Cardiovascular Disease | Admitting: Cardiovascular Disease

## 2014-07-15 DIAGNOSIS — Z7982 Long term (current) use of aspirin: Secondary | ICD-10-CM | POA: Insufficient documentation

## 2014-07-15 DIAGNOSIS — E785 Hyperlipidemia, unspecified: Secondary | ICD-10-CM | POA: Diagnosis not present

## 2014-07-15 DIAGNOSIS — I2510 Atherosclerotic heart disease of native coronary artery without angina pectoris: Secondary | ICD-10-CM

## 2014-07-15 DIAGNOSIS — I10 Essential (primary) hypertension: Secondary | ICD-10-CM | POA: Insufficient documentation

## 2014-07-15 DIAGNOSIS — E119 Type 2 diabetes mellitus without complications: Secondary | ICD-10-CM | POA: Diagnosis not present

## 2014-07-15 DIAGNOSIS — I429 Cardiomyopathy, unspecified: Secondary | ICD-10-CM | POA: Insufficient documentation

## 2014-07-15 DIAGNOSIS — I420 Dilated cardiomyopathy: Secondary | ICD-10-CM

## 2014-07-15 HISTORY — PX: LEFT HEART CATHETERIZATION WITH CORONARY ANGIOGRAM: SHX5451

## 2014-07-15 LAB — GLUCOSE, CAPILLARY: GLUCOSE-CAPILLARY: 236 mg/dL — AB (ref 70–99)

## 2014-07-15 SURGERY — LEFT HEART CATHETERIZATION WITH CORONARY ANGIOGRAM
Anesthesia: LOCAL

## 2014-07-15 MED ORDER — ASPIRIN 81 MG PO CHEW: 81.0000 mg | CHEWABLE_TABLET | ORAL | Status: DC

## 2014-07-15 MED ORDER — MIDAZOLAM HCL 2 MG/2ML IJ SOLN
INTRAMUSCULAR | Status: AC
  Filled 2014-07-15: qty 2

## 2014-07-15 MED ORDER — FENTANYL CITRATE (PF) 100 MCG/2ML IJ SOLN
INTRAMUSCULAR | Status: AC
  Filled 2014-07-15: qty 2

## 2014-07-15 MED ORDER — VERAPAMIL HCL 2.5 MG/ML IV SOLN
INTRAVENOUS | Status: AC
Start: 2014-07-15 — End: 2014-07-15
  Filled 2014-07-15: qty 2

## 2014-07-15 MED ORDER — SODIUM CHLORIDE 0.9 % IJ SOLN: 3.0000 mL | Freq: Two times a day (BID) | INTRAMUSCULAR | Status: DC

## 2014-07-15 MED ORDER — SODIUM CHLORIDE 0.9 % IV SOLN: 1.0000 mL/kg/h | INTRAVENOUS | Status: DC

## 2014-07-15 MED ORDER — NITROGLYCERIN 1 MG/10 ML FOR IR/CATH LAB
INTRA_ARTERIAL | Status: AC
  Filled 2014-07-15: qty 10

## 2014-07-15 MED ORDER — SODIUM CHLORIDE 0.9 % IV SOLN
INTRAVENOUS | Status: DC
  Administered 2014-07-15: 1000 mL via INTRAVENOUS

## 2014-07-15 MED ORDER — ONDANSETRON HCL 4 MG/2ML IJ SOLN: 4.0000 mg | Freq: Four times a day (QID) | INTRAMUSCULAR | Status: DC | PRN

## 2014-07-15 MED ORDER — LIDOCAINE HCL (PF) 1 % IJ SOLN
INTRAMUSCULAR | Status: AC
  Filled 2014-07-15: qty 30

## 2014-07-15 MED ORDER — SODIUM CHLORIDE 0.9 % IJ SOLN: 3.0000 mL | INTRAMUSCULAR | Status: DC | PRN

## 2014-07-15 MED ORDER — ACETAMINOPHEN 325 MG PO TABS: 650.0000 mg | ORAL_TABLET | ORAL | Status: DC | PRN

## 2014-07-15 MED ORDER — SODIUM CHLORIDE 0.9 % IV SOLN: 250.0000 mL | INTRAVENOUS | Status: DC | PRN

## 2014-07-15 MED ORDER — HEPARIN SODIUM (PORCINE) 1000 UNIT/ML IJ SOLN
INTRAMUSCULAR | Status: AC
  Filled 2014-07-15: qty 1

## 2014-07-15 MED ORDER — HEPARIN (PORCINE) IN NACL 2-0.9 UNIT/ML-% IJ SOLN
INTRAMUSCULAR | Status: AC
  Filled 2014-07-15: qty 1000

## 2014-07-15 NOTE — H&P (View-Only) (Signed)
Cardiology Office Note   Date:  07/05/2014   ID:  Paul Yu, DOB 1955-08-03, MRN [id number]  PCP:  Nance Pear., NP    Chief Complaint  Patient presents with  . Cardiomyopathy  . Hypertension      History of Present Illness: Paul Yu is a 59 y.o. male who presents for evaluation of abnormal stress test.  He has a history of HTN, DM and dyslipidemia and had a nuclear stress test done for risk stratification.  This showed normal perfusion but a reduced EF.  He denies any exertional chest pain or pressure.  He will have a bloated feeling in his upper abdomen after eating a large meal.  He occasionally has some SOB when over exerting himself.  He rarely feels an extra heart beat.  He denies any dizziness, LE edema or syncope.      Past Medical History  Diagnosis Date  . Diabetes mellitus without complication     Past Surgical History  Procedure Laterality Date  . Tonsillectomy  1966     Current Outpatient Prescriptions  Medication Sig Dispense Refill  . amLODipine (NORVASC) 5 MG tablet Take 1 tablet (5 mg total) by mouth daily. 30 tablet 3  . aspirin EC 81 MG tablet Take 1 tablet (81 mg total) by mouth daily.    Marland Kitchen atorvastatin (LIPITOR) 40 MG tablet Take 1 tablet (40 mg total) by mouth daily. 30 tablet 2  . Blood Glucose Monitoring Suppl (ACCU-CHEK AVIVA PLUS) W/DEVICE KIT Test blood sugar once daily. Dx Code: E11.9 1 kit 1  . cloNIDine (CATAPRES) 0.1 MG tablet Take 1 tablet (0.1 mg total) by mouth 3 (three) times daily. 90 tablet 0  . glucose blood (ACCU-CHEK AVIVA PLUS) test strip Test blood sugar once daily. Dx Code: E11.9 100 each 23  . lisinopril-hydrochlorothiazide (PRINZIDE,ZESTORETIC) 20-25 MG per tablet Take 1 tablet by mouth daily. 30 tablet 1  . metFORMIN (GLUCOPHAGE) 1000 MG tablet Take 1 tablet (1,000 mg total) by mouth 2 (two) times daily with a meal. 60 tablet 5  . metroNIDAZOLE (METROGEL) 0.75 % gel Apply once daily to face 45 g 2  . ONETOUCH  DELICA LANCETS FINE MISC Use to check blood sugar once a day  11  . carvedilol (COREG) 6.25 MG tablet Take 1 tablet (6.25 mg total) by mouth 2 (two) times daily. 180 tablet 3   No current facility-administered medications for this visit.    Allergies:   Review of patient's allergies indicates no known allergies.    Social History:  The patient  reports that he has never smoked. He has never used smokeless tobacco. He reports that he drinks alcohol. He reports that he does not use illicit drugs.   Family History:  The patient's family history includes Cancer in his mother; Diabetes in his maternal grandmother. There is no history of Heart disease.    ROS:  Please see the history of present illness.   Otherwise, review of systems are positive for none.   All other systems are reviewed and negative.    PHYSICAL EXAM: VS:  BP 150/90 mmHg  Pulse 62  Ht _0  (1.778 m)  Wt 210 lb 12.8 oz (95.618 kg)  BMI 30.25 kg/m2  SpO2 99% , BMI Body mass index is 30.25 kg/(m^2). GEN: Well nourished, well developed, in no acute distress HEENT: normal Neck: no JVD, carotid bruits, or masses Cardiac: RRR; no murmurs, rubs, or gallops,no edema  Respiratory:  clear to auscultation bilaterally, normal  work of breathing GI: soft, nontender, nondistended, + BS MS: no deformity or atrophy Skin: warm and dry, no rash Neuro:  Strength and sensation are intact Psych: euthymic mood, full affect   EKG:  EKG is not ordered today.    Recent Labs: 05/03/2014: ALT 25; Hemoglobin 15.4; Platelets 302.0; TSH 1.35 06/28/2014: BUN 19; Creatinine 0.94; Potassium 3.7; Sodium 135    Lipid Panel    Component Value Date/Time   CHOL 179 05/03/2014 0941   TRIG 107.0 05/03/2014 0941   HDL 45.50 05/03/2014 0941   CHOLHDL 4 05/03/2014 0941   VLDL 21.4 05/03/2014 0941   LDLCALC 112* 05/03/2014 0941      Wt Readings from Last 3 Encounters:  07/05/14 210 lb 12.8 oz (95.618 kg)  06/28/14 209 lb (94.802 kg)    05/24/14 213 lb 6.4 oz (96.798 kg)     Reviewed results of nuclear stress test at length with patient   ASSESSMENT AND PLAN:  1.  Abnormal  High risk nuclear stress test with no inducible ischemia but reduced EF.  This is most likely secondary to hypertensive DCM but he does have multiple cardiac risk factors including HTN, DM and dyslipidemia.  He is asymptomatic from a cardiac standpoint.   2.  Moderate LV dysfunction with EF 35%.  This is most likely secondary to hypertensive DCM.  He needs aggressive treatment for HTN.  I will add Coreg 6.3m BID for better BP control and for LV dysfunction.  He will continue on ACE I.  I have recommended that we proceed with left heart cath to make sure he does not have CAD.  Cardiac catheterization was discussed with the patient fully including risks on myocardial infarction, death, stroke, bleeding, arrhythmia, dye allergy, renal insufficiency or bleeding.  All patient questions and concerns were discussed and the patient understands and is willing to proceed.   3.  HTN - still elevated - add Coreg as above    Current medicines are reviewed at length with the patient today.  The patient does not have concerns regarding medicines.  The following changes have been made:  Added Coreg 6.232mBID  Labs/ tests ordered today include: see above assessment and plan  Orders Placed This Encounter  Procedures  . Basic Metabolic Panel (BMET)  . CBC w/Diff  . PTT  . INR/PT     Disposition:   FU with nurse in 1 week for BP check and with me in 2 months   Signed, TUSueanne MargaritaMD  07/05/2014 10:45 AM    [facility] [address]  [id number]: [phone]: [phone]

## 2014-07-15 NOTE — CV Procedure (Signed)
    Cardiac Catheterization Procedure Note  Name: Paul Yu MRN: [id number] DOB: 04/24/1955  Procedure: Left Heart Cath, Selective Coronary Angiography  Indication: Cardiomyopathy   Procedural Details: The right wrist was prepped, draped, and anesthetized with 1% lidocaine. Using the modified Seldinger technique, a 5/6 French Slender sheath was introduced into the right radial artery. 3 mg of verapamil was administered through the sheath, weight-based unfractionated heparin was administered intravenously. Standard Judkins catheters were used for selective coronary angiography. Catheter exchanges were performed over an exchange length guidewire. There were no immediate procedural complications. A TR band was used for radial hemostasis at the completion of the procedure.  The patient was transferred to the post catheterization recovery area for further monitoring.  Procedural Findings: Hemodynamics: AO 134/66 mean 89 LV 132/6  Coronary angiography: Coronary dominance: right  Left mainstem: There is 30% proximal left main disease, otherwise widely patent vessel with normal contrast reflux  Left anterior descending (LAD): The proximal vessel is patent. The mid vessel after the first septal perforator has diffuse 60% stenosis. The distal vessel is patent with mild irregularity.  Left circumflex (LCx): Widely patent vessel with mild diffuse mid-vessel stenosis of 40%. The OM branches are patent.   Right coronary artery (RCA): Dominant vessel. There is 30% proximal/ostial stenosis. Diffuse irregularity is noted without high-grade obstruction. The PDA and PLA branches are patent with mild diffuse irregularity.  Left ventriculography: Not performed. LVEF 35% by Myoview scan.  Contrast: 55 cc Omnipaque  Estimated Blood Loss: minimal  Final Conclusions:   1. Mild - moderate diffuse nonobstructive CAD 2. Known cardiomyopathy (suspect nonischemic) with normal LVEDP  Recommendations:  medical therapy. Will need aggressive risk reduction as he has diffuse atherosclerosis.   Tonny Bollman MD, [facility] 07/15/2014, 11:10 AM

## 2014-07-15 NOTE — Discharge Instructions (Addendum)
HOLD METFORMIN FOR 48 HOURS  Radial Site Care Refer to this sheet in the next few weeks. These instructions provide you with information on caring for yourself after your procedure. Your caregiver may also give you more specific instructions. Your treatment has been planned according to current medical practices, but problems sometimes occur. Call your caregiver if you have any problems or questions after your procedure. HOME CARE INSTRUCTIONS  You may shower the day after the procedure.Remove the bandage (dressing) and gently wash the site with plain soap and water.Gently pat the site dry.  Do not apply powder or lotion to the site.  Do not submerge the affected site in water for 3 to 5 days.  Inspect the site at least twice daily.  Do not flex or bend the affected arm for 24 hours.  No lifting over 5 pounds (2.3 kg) for 5 days after your procedure.  Do not drive home if you are discharged the same day of the procedure. Have someone else drive you.  You may drive 24 hours after the procedure unless otherwise instructed by your caregiver.  Do not operate machinery or power tools for 24 hours.  A responsible adult should be with you for the first 24 hours after you arrive home. What to expect:  Any bruising will usually fade within 1 to 2 weeks.  Blood that collects in the tissue (hematoma) may be painful to the touch. It should usually decrease in size and tenderness within 1 to 2 weeks. SEEK IMMEDIATE MEDICAL CARE IF:  You have unusual pain at the radial site.  You have redness, warmth, swelling, or pain at the radial site.  You have drainage (other than a small amount of blood on the dressing).  You have chills.  You have a fever or persistent symptoms for more than 72 hours.  You have a fever and your symptoms suddenly get worse.  Your arm becomes pale, cool, tingly, or numb.  You have heavy bleeding from the site. Hold pressure on the site. Document Released:  04/13/2010 Document Revised: 06/03/2011 Document Reviewed: 04/13/2010 [facility] Patient Information 2015 [place], [place]. This information is not intended to replace advice given to you by your health care provider. Make sure you discuss any questions you have with your health care provider.                       Return To Work Susie Bubel was treated at our facility. INJURY OR ILLNESS WAS: _____ Work-related __X___ Not work-related _____ Undetermined if work-related RETURN TO WORK  Employee may return to work on: Monday 07/25/14 WORK ACTIVITY RESTRICTIONS Work activities not tolerated include: NONE _____ Bending _____ Prolonged sitting _____ Lifting _____ Squatting _____ Prolonged standing _____ Otelia Limes _____ Reaching _____ Pushing and pulling _____ Walking _____ Other ____________________ Show this Return to Work statement to your supervisor at work as soon as possible. Your employer should be aware of your condition and can help with the necessary work activity restrictions. If you wish to return to work sooner than the date above, or if you have further problems which make it difficult for you to return at that time, please call us or your caregiver. _________________________________________ Physician Name: Dr. Tonny Bollman at [facility]

## 2014-07-15 NOTE — Interval H&P Note (Signed)
History and Physical Interval Note:  07/15/2014 10:27 AM  Paul Yu  has presented today for surgery, with the diagnosis of abnormal stress test  The various methods of treatment have been discussed with the patient and family. After consideration of risks, benefits and other options for treatment, the patient has consented to  Procedure(s): LEFT HEART CATHETERIZATION WITH CORONARY ANGIOGRAM (N/A) as a surgical intervention .  The patient's history has been reviewed, patient examined, no change in status, stable for surgery.  I have reviewed the patient's chart and labs.  Questions were answered to the patient's satisfaction.     Tonny Bollman

## 2014-07-17 DIAGNOSIS — I420 Dilated cardiomyopathy: Secondary | ICD-10-CM | POA: Insufficient documentation

## 2014-07-18 ENCOUNTER — Other Ambulatory Visit: Payer: Self-pay | Admitting: Family

## 2014-07-23 ENCOUNTER — Other Ambulatory Visit: Payer: Self-pay | Admitting: Family

## 2014-08-05 ENCOUNTER — Encounter: Payer: Self-pay | Admitting: Cardiology

## 2014-08-15 ENCOUNTER — Encounter: Payer: Self-pay | Admitting: Cardiology

## 2014-08-16 NOTE — Telephone Encounter (Signed)
error 

## 2014-09-02 ENCOUNTER — Ambulatory Visit: Payer: BLUE CROSS/BLUE SHIELD | Admitting: Cardiology

## 2014-09-18 ENCOUNTER — Other Ambulatory Visit: Payer: Self-pay | Admitting: Family

## 2014-10-25 ENCOUNTER — Other Ambulatory Visit: Payer: Self-pay | Admitting: Family

## 2014-10-25 NOTE — Telephone Encounter (Signed)
Sent 90 day supply to pharmacy with 0 refills, Pt needs to schedule a follow up appt for B/P check.

## 2014-11-04 NOTE — Telephone Encounter (Signed)
Patients states he works 5 days a week and will call back to schedule follow up

## 2014-11-10 ENCOUNTER — Other Ambulatory Visit: Payer: Self-pay | Admitting: Family

## 2014-11-10 NOTE — Telephone Encounter (Signed)
Sent 90 day supply to pharmacy, Pt is due for an office visit.

## 2014-11-22 ENCOUNTER — Other Ambulatory Visit: Payer: Self-pay | Admitting: Family

## 2014-11-23 NOTE — Telephone Encounter (Signed)
30 day supply of clonidine sent to pharmacy. Please let pt know that he was due for a follow up in mid June and is past due. We will not be able to provider further refills until he can be seen in the office.  Please offer pt a 7am appt or Monday after 5pm.  Thanks!

## 2014-12-13 NOTE — Telephone Encounter (Signed)
Scheduled for 12/23/14.

## 2014-12-23 ENCOUNTER — Ambulatory Visit (INDEPENDENT_AMBULATORY_CARE_PROVIDER_SITE_OTHER): Payer: BLUE CROSS/BLUE SHIELD | Admitting: Family

## 2014-12-23 ENCOUNTER — Encounter: Payer: Self-pay | Admitting: Family

## 2014-12-23 VITALS — BP 151/78 | HR 70 | Temp 98.4°F | Resp 16 | Ht 70.0 in | Wt 219.2 lb

## 2014-12-23 DIAGNOSIS — IMO0002 Reserved for concepts with insufficient information to code with codable children: Secondary | ICD-10-CM

## 2014-12-23 DIAGNOSIS — N529 Male erectile dysfunction, unspecified: Secondary | ICD-10-CM | POA: Diagnosis not present

## 2014-12-23 DIAGNOSIS — E785 Hyperlipidemia, unspecified: Secondary | ICD-10-CM | POA: Diagnosis not present

## 2014-12-23 DIAGNOSIS — E1165 Type 2 diabetes mellitus with hyperglycemia: Secondary | ICD-10-CM | POA: Diagnosis not present

## 2014-12-23 DIAGNOSIS — I10 Essential (primary) hypertension: Secondary | ICD-10-CM

## 2014-12-23 DIAGNOSIS — E119 Type 2 diabetes mellitus without complications: Secondary | ICD-10-CM

## 2014-12-23 LAB — BASIC METABOLIC PANEL
BUN: 16 mg/dL (ref 6–23)
CALCIUM: 9.7 mg/dL (ref 8.4–10.5)
CHLORIDE: 100 meq/L (ref 96–112)
CO2: 32 meq/L (ref 19–32)
CREATININE: 0.96 mg/dL (ref 0.40–1.50)
GFR: 85.31 mL/min (ref >60.00)
Glucose, Bld: 184 mg/dL — ABNORMAL HIGH (ref 70–99)
Potassium: 4.5 mEq/L (ref 3.5–5.1)
SODIUM: 141 meq/L (ref 135–145)

## 2014-12-23 LAB — LIPID PANEL
CHOLESTEROL: 194 mg/dL (ref 0–200)
HDL: 37 mg/dL — ABNORMAL LOW (ref >39.00)
NonHDL: 157.38
TRIGLYCERIDES: 252 mg/dL — AB (ref 0.0–149.0)
Total CHOL/HDL Ratio: 5
VLDL: 50.4 mg/dL — ABNORMAL HIGH (ref 0.0–40.0)

## 2014-12-23 LAB — HEMOGLOBIN A1C: Hgb A1c MFr Bld: 8.4 % — ABNORMAL HIGH (ref 4.6–6.5)

## 2014-12-23 LAB — LDL CHOLESTEROL, DIRECT: Direct LDL: 131 mg/dL

## 2014-12-23 MED ORDER — SILDENAFIL CITRATE 50 MG PO TABS: 50.0000 mg | ORAL_TABLET | Freq: Every day | ORAL | Status: DC | PRN

## 2014-12-23 MED ORDER — AMLODIPINE BESYLATE 10 MG PO TABS: 10.0000 mg | ORAL_TABLET | Freq: Every day | ORAL | Status: DC

## 2014-12-23 NOTE — Progress Notes (Signed)
Pre visit review using our clinic review tool, if applicable. No additional management support is needed unless otherwise documented below in the visit note. 

## 2014-12-23 NOTE — Assessment & Plan Note (Signed)
Last A1C above goal. Now on metformin. Obtain follow up a1c.

## 2014-12-23 NOTE — Assessment & Plan Note (Signed)
Trial of viagra 

## 2014-12-23 NOTE — Assessment & Plan Note (Signed)
BP up today. Was better when he was taking 10mg  of the amlodipine, resume 10mg  of amlodipine.

## 2014-12-23 NOTE — Progress Notes (Signed)
Subjective:    Patient ID: Paul Yu, male    DOB: [birth year], 59 y.o.   MRN: [id number]  HPI  Mr. Paul Yu is a 59 yr old male who presents today for follow up.    1) DM2- reports that he is not having any GI side effects from metformin.  Lab Results  Component Value Date   HGBA1C 9.3* 05/24/2014   Lab Results  Component Value Date   MICROALBUR 13.6* 05/24/2014   LDLCALC 112* 05/03/2014   CREATININE 0.94 07/05/2014   2) HTN- He had a nurse visit back in April and his BP was noted to be elevated. It was recommended that he increase his amlodipine from 30m to 151m  Apparently, the 73m40mab was refilled and he never started the 68m87mb.  He is also maintained on  BP Readings from Last 3 Encounters:  12/23/14 151/78  07/15/14 131/66  07/12/14 164/84   3) Hyperlipidemia- he is currently maintained on diet alone.  Last LDL was elevated at 112.   ED- pt reports some issues with ED  Review of Systems See HPI  Past Medical History  Diagnosis Date  . Diabetes mellitus without complication     Social History   Social History  . Marital Status: Married    Spouse Name: N/A  . Number of Children: N/A  . Years of Education: N/A   Occupational History  . Not on file.   Social History Main Topics  . Smoking status: Never Smoker   . Smokeless tobacco: Never Used  . Alcohol Use: 0.0 oz/week    0 Standard drinks or equivalent per week     Comment: rarely,   . Drug Use: No  . Sexual Activity: Not on file   Other Topics Concern  . Not on file   Social History Narrative   4th marriage-    Daughter born 2002   Son 1982-[place]   Completed 9th grade- GED   Enjoys working on cars,         Past Surgical History  Procedure Laterality Date  . Tonsillectomy  1966  . Left heart catheterization with coronary angiogram N/A 07/15/2014    Procedure: LEFT HEART CATHETERIZATION WITH CORONARY ANGIOGRAM;  Surgeon: MichSherren Mocha;  [facility];  Service: Cardiovascular;  Laterality: N/A;    Family History  Problem Relation Age of Onset  . Cancer Mother     ?lung  . Diabetes Maternal Grandmother   . Heart disease Neg Hx     Allergies  Allergen Reactions  . Atorvastatin Other (See Comments)    Muscle aches    Current Outpatient Prescriptions on File Prior to Visit  Medication Sig Dispense Refill  . amLODipine (NORVASC) 10 MG tablet Take 1 tablet (10 mg total) by mouth daily. 30 tablet 2  . aspirin EC 81 MG tablet Take 1 tablet (81 mg total) by mouth daily.    . Blood Glucose Monitoring Suppl (ACCU-CHEK AVIVA PLUS) W/DEVICE KIT Test blood sugar once daily. Dx Code: E11.9 1 kit 1  . carvedilol (COREG) 6.25 MG tablet Take 1 tablet (6.25 mg total) by mouth 2 (two) times daily. 180 tablet 3  . cetirizine (ZYRTEC) 10 MG tablet Take 10 mg by mouth daily as needed for allergies.    . cloNIDine (CATAPRES) 0.1 MG tablet TAKE 1 TABLET(0.1 MG) BY MOUTH THREE TIMES DAILY 90 tablet 0  . glucose  blood (ACCU-CHEK AVIVA PLUS) test strip Test blood sugar once daily. Dx Code: E11.9 100 each 23  . lisinopril-hydrochlorothiazide (PRINZIDE,ZESTORETIC) 20-25 MG per tablet TAKE 1 TABLET BY MOUTH DAILY 30 tablet 5  . metFORMIN (GLUCOPHAGE) 1000 MG tablet TAKE 1 TABLET(1000 MG) BY MOUTH TWICE DAILY WITH A MEAL 180 tablet 0  . metroNIDAZOLE (METROGEL) 0.75 % gel Apply once daily to face 45 g 2  . ONETOUCH DELICA LANCETS FINE MISC Use to check blood sugar once a day  11   No current facility-administered medications on file prior to visit.    BP 151/78 mmHg  Pulse 70  Temp(Src) 98.4 F (36.9 C) (Oral)  Resp 16  Ht _0  (1.778 m)  Wt 219 lb 3.2 oz (99.428 kg)  BMI 31.45 kg/m2  SpO2 99%       Objective:   Physical Exam  Constitutional: He is oriented to person, place, and time. He appears well-developed and well-nourished. No distress.  HENT:  Head: Normocephalic and atraumatic.  Cardiovascular:  Normal rate and regular rhythm.   No murmur heard. Pulmonary/Chest: Effort normal and breath sounds normal. No respiratory distress. He has no wheezes. He has no rales.  Musculoskeletal: He exhibits no edema.  Neurological: He is alert and oriented to person, place, and time.  Skin: Skin is warm and dry.  Psychiatric: He has a normal mood and affect. His behavior is normal. Thought content normal.          Assessment & Plan:

## 2014-12-23 NOTE — Patient Instructions (Addendum)
Please schedule eye exam. Complete lab work prior to leaving.   Schedule a complete physical at the front desk.

## 2014-12-23 NOTE — Assessment & Plan Note (Signed)
Has been intolerant to statins in the past.  Obtain lipid panel. Last LDL above goal.

## 2014-12-25 ENCOUNTER — Other Ambulatory Visit: Payer: Self-pay | Admitting: Family

## 2014-12-25 NOTE — Telephone Encounter (Signed)
LDL is above goal.  If he is willing, I would recommend trial of pravastatin which is a statin that is better tolerated than atorvastatin.  Rx pended.  If he has recurrent muscle aches on statin then d/c statin and let me know. Sugar is uncontrolled.

## 2014-12-26 ENCOUNTER — Telehealth: Payer: Self-pay | Admitting: *Deleted

## 2014-12-26 NOTE — Telephone Encounter (Signed)
PA for Viagra 50mg  tabs initiated. Awaiting determination. JG//CMA

## 2014-12-26 NOTE — Telephone Encounter (Signed)
Left message for pt to return my call.

## 2014-12-28 MED ORDER — SITAGLIPTIN PHOSPHATE 100 MG PO TABS: 100.0000 mg | ORAL_TABLET | Freq: Every day | ORAL | Status: DC

## 2014-12-28 MED ORDER — PRAVASTATIN SODIUM 20 MG PO TABS: 20.0000 mg | ORAL_TABLET | Freq: Every day | ORAL | Status: DC

## 2014-12-28 NOTE — Telephone Encounter (Signed)
Notified pt and he is agreeable to try both medications. Rxs sent.

## 2015-01-05 NOTE — Telephone Encounter (Signed)
PA approved.

## 2015-01-24 ENCOUNTER — Other Ambulatory Visit: Payer: Self-pay | Admitting: Family

## 2015-01-28 ENCOUNTER — Other Ambulatory Visit: Payer: Self-pay | Admitting: Family

## 2015-01-30 NOTE — Telephone Encounter (Signed)
Medication Detail      Disp Refills Start End     cloNIDine (CATAPRES) 0.1 MG tablet 90 tablet 0 11/23/2014     Sig: TAKE 1 TABLET(0.1 MG) BY MOUTH THREE TIMES DAILY    Notes to Pharmacy: PT NEEDS OFFICE VISIT FOR FURTHER REFILLS    E-Prescribing Status: Receipt confirmed by pharmacy (11/23/2014 12:56 PM EDT)     Pharmacy    [id number] - [place], [place] - [address]   LAST OV: 12/23/14 Patient Needs to Schedule full Physical. Refill sent per [facility] refill protocol/SLS

## 2015-02-06 ENCOUNTER — Other Ambulatory Visit: Payer: Self-pay | Admitting: Family

## 2015-03-31 ENCOUNTER — Telehealth: Payer: Self-pay | Admitting: Family

## 2015-03-31 NOTE — Telephone Encounter (Signed)
Pt has been scheduled.  °

## 2015-03-31 NOTE — Telephone Encounter (Signed)
Clonidine refill sent to pharmacy. Pt is due for fasting physical. Please call pt to arrange appt. Thanks!

## 2015-05-04 ENCOUNTER — Encounter: Payer: Self-pay | Admitting: *Deleted

## 2015-05-04 ENCOUNTER — Telehealth: Payer: Self-pay | Admitting: *Deleted

## 2015-05-04 NOTE — Telephone Encounter (Signed)
Pre-Visit Call completed with patient and chart updated.   Pre-Visit Info documented in Specialty Comments under Snap[employer].    

## 2015-05-05 ENCOUNTER — Ambulatory Visit (INDEPENDENT_AMBULATORY_CARE_PROVIDER_SITE_OTHER): Payer: BLUE CROSS/BLUE SHIELD | Admitting: Family

## 2015-05-05 ENCOUNTER — Encounter: Payer: Self-pay | Admitting: Family

## 2015-05-05 VITALS — BP 124/72 | HR 57 | Temp 98.2°F | Resp 16 | Ht 69.0 in | Wt 216.8 lb

## 2015-05-05 DIAGNOSIS — I10 Essential (primary) hypertension: Secondary | ICD-10-CM

## 2015-05-05 DIAGNOSIS — Z0000 Encounter for general adult medical examination without abnormal findings: Secondary | ICD-10-CM | POA: Diagnosis not present

## 2015-05-05 DIAGNOSIS — E1121 Type 2 diabetes mellitus with diabetic nephropathy: Secondary | ICD-10-CM | POA: Diagnosis not present

## 2015-05-05 DIAGNOSIS — E1122 Type 2 diabetes mellitus with diabetic chronic kidney disease: Secondary | ICD-10-CM | POA: Diagnosis not present

## 2015-05-05 DIAGNOSIS — E1165 Type 2 diabetes mellitus with hyperglycemia: Secondary | ICD-10-CM

## 2015-05-05 LAB — HEPATIC FUNCTION PANEL
ALK PHOS: 97 U/L (ref 39–117)
ALT: 32 U/L (ref 0–53)
AST: 18 U/L (ref 0–37)
Albumin: 4.3 g/dL (ref 3.5–5.2)
BILIRUBIN TOTAL: 0.7 mg/dL (ref 0.2–1.2)
Bilirubin, Direct: 0.1 mg/dL (ref 0.0–0.3)
Total Protein: 7.2 g/dL (ref 6.0–8.3)

## 2015-05-05 LAB — URINALYSIS, ROUTINE W REFLEX MICROSCOPIC
BILIRUBIN URINE: NEGATIVE
HGB URINE DIPSTICK: NEGATIVE
LEUKOCYTES UA: NEGATIVE
NITRITE: NEGATIVE
Specific Gravity, Urine: >=1.03 — AB (ref 1.000–1.030)
Total Protein, Urine: 30 — AB
URINE GLUCOSE: NEGATIVE
UROBILINOGEN UA: 0.2 (ref 0.0–1.0)
pH: 5.5 (ref 5.0–8.0)

## 2015-05-05 LAB — TSH: TSH: 1.36 u[IU]/mL (ref 0.35–4.50)

## 2015-05-05 LAB — BASIC METABOLIC PANEL
BUN: 18 mg/dL (ref 6–23)
CALCIUM: 9.2 mg/dL (ref 8.4–10.5)
CHLORIDE: 101 meq/L (ref 96–112)
CO2: 30 meq/L (ref 19–32)
Creatinine, Ser: 0.97 mg/dL (ref 0.40–1.50)
GFR: 84.19 mL/min (ref >60.00)
Glucose, Bld: 166 mg/dL — ABNORMAL HIGH (ref 70–99)
POTASSIUM: 3.7 meq/L (ref 3.5–5.1)
SODIUM: 139 meq/L (ref 135–145)

## 2015-05-05 LAB — LIPID PANEL
CHOLESTEROL: 133 mg/dL (ref 0–200)
HDL: 34.7 mg/dL — ABNORMAL LOW (ref >39.00)
LDL Cholesterol: 74 mg/dL (ref 0–99)
NONHDL: 98.59
Total CHOL/HDL Ratio: 4
Triglycerides: 125 mg/dL (ref 0.0–149.0)
VLDL: 25 mg/dL (ref 0.0–40.0)

## 2015-05-05 LAB — PSA: PSA: 3.86 ng/mL (ref 0.10–4.00)

## 2015-05-05 LAB — HEMOGLOBIN A1C: HEMOGLOBIN A1C: 7.8 % — AB (ref 4.6–6.5)

## 2015-05-05 NOTE — Assessment & Plan Note (Signed)
BP is stable on current meds.  Continue same.  

## 2015-05-05 NOTE — Assessment & Plan Note (Signed)
Discussed diet, exercise, weight loss. Declines pneumovax/flu shots. Declines colo.

## 2015-05-05 NOTE — Progress Notes (Signed)
Subjective:    Patient ID: Paul Yu, male    DOB: 1955-07-26, 60 y.o.   MRN: [id number]  HPI  Patient presents today for complete physical.  Immunizations: tetanus up to date, declines flu shot, declines pnuemovax Diet: Needs improvement Exercise: Not exercising. Colonoscopy: due Dental: due  Vision: due, wife will schedule  DM2- maintained on metformin 1069m bid and Januvia. 150-180's when he checks but rarely checks.  Lab Results  Component Value Date   HGBA1C 8.4* 12/23/2014   HGBA1C 9.3* 05/24/2014   Lab Results  Component Value Date   MICROALBUR 13.6* 05/24/2014   LDLCALC 112* 05/03/2014   CREATININE 0.96 12/23/2014   HTN- on amlodipine, coreg, clonidine, lisinopril-hctz.  BP Readings from Last 3 Encounters:  05/05/15 124/72  12/23/14 151/78  07/15/14 131/66    Review of Systems  Constitutional: Negative for unexpected weight change.  HENT: Negative for hearing loss and rhinorrhea.   Eyes: Negative for visual disturbance.  Respiratory:       Rare cough  Gastrointestinal: Negative for diarrhea, constipation and blood in stool.  Genitourinary: Negative for dysuria and frequency.  Musculoskeletal: Negative for myalgias and arthralgias.  Skin: Negative for rash.  Neurological: Negative for headaches.  Hematological: Negative for adenopathy.  Psychiatric/Behavioral:       Denies depression/anxiety     Past Medical History  Diagnosis Date  . Diabetes mellitus without complication ([facility]     Social History   Social History  . Marital Status: Married    Spouse Name: N/A  . Number of Children: N/A  . Years of Education: N/A   Occupational History  . Not on file.   Social History Main Topics  . Smoking status: Never Smoker   . Smokeless tobacco: Never Used  . Alcohol Use: 0.0 oz/week    0 Standard drinks or equivalent per week     Comment: rarely,   . Drug Use: No  . Sexual Activity: Not on file   Other Topics Concern  . Not on file    Social History Narrative   4th marriage-    Daughter born 2002   [place] 1982-[place]   Completed 9th grade- GED   Enjoys working on cars,         Past Surgical History  Procedure Laterality Date  . Tonsillectomy  1966  . Left heart catheterization with coronary angiogram N/A 07/15/2014    Procedure: LEFT HEART CATHETERIZATION WITH CORONARY ANGIOGRAM;  Surgeon: MSherren Mocha MD;  [facility];  Service: Cardiovascular;  Laterality: N/A;    Family History  Problem Relation Age of Onset  . Cancer Mother     ?lung  . Diabetes Maternal Grandmother   . Heart disease Neg Hx     Allergies  Allergen Reactions  . Atorvastatin Other (See Comments)    Muscle aches    Current Outpatient Prescriptions on File Prior to Visit  Medication Sig Dispense Refill  . amLODipine (NORVASC) 10 MG tablet Take 1 tablet (10 mg total) by mouth daily. 90 tablet 1  . aspirin EC 81 MG tablet Take 1 tablet (81 mg total) by mouth daily.    . Blood Glucose Monitoring Suppl (ACCU-CHEK AVIVA PLUS) W/DEVICE KIT Test blood sugar once daily. Dx Code: E11.9 1 kit 1  . carvedilol (COREG) 6.25 MG tablet Take 1 tablet (6.25 mg total) by mouth 2 (two) times daily. 180 tablet 3  . cetirizine (ZYRTEC)  10 MG tablet Take 10 mg by mouth daily as needed for allergies.    . cloNIDine (CATAPRES) 0.1 MG tablet TAKE 1 TABLET BY MOUTH THREE TIMES DAILY 90 tablet 0  . glucose blood (ACCU-CHEK AVIVA PLUS) test strip Test blood sugar once daily. Dx Code: E11.9 100 each 23  . lisinopril-hydrochlorothiazide (PRINZIDE,ZESTORETIC) 20-25 MG tablet TAKE 1 TABLET BY MOUTH DAILY 30 tablet 6  . metFORMIN (GLUCOPHAGE) 1000 MG tablet TAKE 1 TABLET(1000 MG) BY MOUTH TWICE DAILY WITH A MEAL 180 tablet 1  . ONETOUCH DELICA LANCETS FINE MISC Reported on 05/04/2015  11  . pravastatin (PRAVACHOL) 20 MG tablet Take 1 tablet (20 mg total) by mouth daily. 30 tablet 5  . sildenafil  (VIAGRA) 50 MG tablet Take 1 tablet (50 mg total) by mouth daily as needed for erectile dysfunction. 10 tablet 2  . sitaGLIPtin (JANUVIA) 100 MG tablet Take 1 tablet (100 mg total) by mouth daily. 30 tablet 5   No current facility-administered medications on file prior to visit.    BP 124/72 mmHg  Pulse 57  Temp(Src) 98.2 F (36.8 C) (Oral)  Resp 16  Ht 5' 9"  (1.753 m)  Wt 216 lb 12.8 oz (98.34 kg)  BMI 32.00 kg/m2  SpO2 99%       Objective:   Physical Exam  Physical Exam  Constitutional: He is oriented to person, place, and time. He appears well-developed and well-nourished. No distress.  HENT:  Head: Normocephalic and atraumatic.  Right Ear: Tympanic membrane and ear canal normal.  Left Ear: Tympanic membrane and ear canal normal.  Mouth/Throat: Oropharynx is clear and moist.  Eyes: Pupils are equal, round, and reactive to light. No scleral icterus.  Neck: Normal range of motion. No thyromegaly present.  Cardiovascular: Normal rate and regular rhythm.   No murmur heard. Pulmonary/Chest: Effort normal and breath sounds normal. No respiratory distress. He has no wheezes. He has no rales. He exhibits no tenderness.  Abdominal: Soft. Bowel sounds are normal. He exhibits no distension and no mass. There is no tenderness. There is no rebound and no guarding.  Musculoskeletal: He exhibits no edema.  Lymphadenopathy:    He has no cervical adenopathy.  Neurological: He is alert and oriented to person, place, and time. He has normal patellar reflexes. He exhibits normal muscle tone. Coordination normal.  Skin: Skin is warm and dry.  Psychiatric: He has a normal mood and affect. His behavior is normal. Judgment and thought content normal.          Assessment & Plan:         Assessment & Plan:

## 2015-05-05 NOTE — Assessment & Plan Note (Signed)
Suspect A1C remains above goal. Continue current meds. Discussed adding another medication if A1C >7.

## 2015-05-05 NOTE — Progress Notes (Signed)
Pre visit review using our clinic review tool, if applicable. No additional management support is needed unless otherwise documented below in the visit note. 

## 2015-05-05 NOTE — Patient Instructions (Addendum)
Please complete a stool samples and mail back at your earliest convenience. Schedule eye exam and dental exam at your earliest convenience. Work on diabetic diet, exercise, weight loss.

## 2015-05-07 LAB — CBC WITH DIFFERENTIAL/PLATELET
BASOS ABS: 0 10*3/uL (ref 0.0–0.1)
BASOS PCT: 0.3 % (ref 0.0–3.0)
EOS ABS: 0.4 10*3/uL (ref 0.0–0.7)
Eosinophils Relative: 4 % (ref 0.0–5.0)
HEMATOCRIT: 42.7 % (ref 39.0–52.0)
Hemoglobin: 13.9 g/dL (ref 13.0–17.0)
LYMPHS PCT: 25.5 % (ref 12.0–46.0)
Lymphs Abs: 2.5 10*3/uL (ref 0.7–4.0)
MCHC: 32.6 g/dL (ref 30.0–36.0)
MCV: 94.9 fl (ref 78.0–100.0)
Monocytes Absolute: 0.9 10*3/uL (ref 0.1–1.0)
Monocytes Relative: 9.1 % (ref 3.0–12.0)
NEUTROS ABS: 6.1 10*3/uL (ref 1.4–7.7)
Neutrophils Relative %: 61.1 % (ref 43.0–77.0)
PLATELETS: 400 10*3/uL (ref 150.0–400.0)
RBC: 4.5 Mil/uL (ref 4.22–5.81)
RDW: 13.8 % (ref 11.5–15.5)
WBC: 10 10*3/uL (ref 4.0–10.5)

## 2015-05-08 ENCOUNTER — Telehealth: Payer: Self-pay | Admitting: Family

## 2015-05-08 MED ORDER — PIOGLITAZONE HCL 30 MG PO TABS: 30.0000 mg | ORAL_TABLET | Freq: Every day | ORAL | Status: DC

## 2015-05-08 NOTE — Telephone Encounter (Signed)
Cholesterol looks better. Sugar uncontrolled. Add Actos, continue to work on diabetic diet, exercise, weight loss.

## 2015-05-09 NOTE — Telephone Encounter (Signed)
Notified pt and he voices understanding. States his Januvia co-pay doubled and is now $94. Advised pt he could pick up co-pay card from the office and card placed at front desk for pick up.

## 2015-05-11 ENCOUNTER — Other Ambulatory Visit: Payer: Self-pay | Admitting: Family

## 2015-05-24 ENCOUNTER — Other Ambulatory Visit: Payer: BLUE CROSS/BLUE SHIELD

## 2015-05-24 NOTE — Addendum Note (Signed)
Addended by: Eustace Quail on: 05/24/2015 03:47 PM   Modules accepted: Orders

## 2015-05-25 ENCOUNTER — Encounter: Payer: Self-pay | Admitting: Family

## 2015-05-25 ENCOUNTER — Other Ambulatory Visit (INDEPENDENT_AMBULATORY_CARE_PROVIDER_SITE_OTHER): Payer: BLUE CROSS/BLUE SHIELD

## 2015-05-25 DIAGNOSIS — Z1211 Encounter for screening for malignant neoplasm of colon: Secondary | ICD-10-CM | POA: Diagnosis not present

## 2015-05-25 DIAGNOSIS — Z0000 Encounter for general adult medical examination without abnormal findings: Secondary | ICD-10-CM

## 2015-05-25 LAB — FECAL OCCULT BLOOD, IMMUNOCHEMICAL: Fecal Occult Bld: NEGATIVE

## 2015-05-25 LAB — FECAL OCCULT BLOOD, GUAIAC: Fecal Occult Blood: NEGATIVE

## 2015-06-28 ENCOUNTER — Other Ambulatory Visit: Payer: Self-pay | Admitting: Cardiology

## 2015-07-05 ENCOUNTER — Other Ambulatory Visit: Payer: Self-pay | Admitting: Family

## 2015-07-28 ENCOUNTER — Ambulatory Visit: Payer: BLUE CROSS/BLUE SHIELD | Admitting: Family

## 2015-07-30 ENCOUNTER — Other Ambulatory Visit: Payer: Self-pay | Admitting: Family

## 2015-08-11 ENCOUNTER — Encounter: Payer: Self-pay | Admitting: Family

## 2015-08-11 ENCOUNTER — Telehealth: Payer: Self-pay | Admitting: Family

## 2015-08-11 ENCOUNTER — Ambulatory Visit (INDEPENDENT_AMBULATORY_CARE_PROVIDER_SITE_OTHER): Payer: BLUE CROSS/BLUE SHIELD | Admitting: Family

## 2015-08-11 VITALS — HR 69 | Temp 98.0°F | Resp 16 | Ht 69.0 in | Wt 217.6 lb

## 2015-08-11 DIAGNOSIS — E1165 Type 2 diabetes mellitus with hyperglycemia: Secondary | ICD-10-CM

## 2015-08-11 DIAGNOSIS — E785 Hyperlipidemia, unspecified: Secondary | ICD-10-CM | POA: Diagnosis not present

## 2015-08-11 DIAGNOSIS — I10 Essential (primary) hypertension: Secondary | ICD-10-CM

## 2015-08-11 DIAGNOSIS — R809 Proteinuria, unspecified: Secondary | ICD-10-CM

## 2015-08-11 DIAGNOSIS — E1129 Type 2 diabetes mellitus with other diabetic kidney complication: Secondary | ICD-10-CM

## 2015-08-11 DIAGNOSIS — IMO0002 Reserved for concepts with insufficient information to code with codable children: Secondary | ICD-10-CM

## 2015-08-11 DIAGNOSIS — E1121 Type 2 diabetes mellitus with diabetic nephropathy: Secondary | ICD-10-CM

## 2015-08-11 DIAGNOSIS — E1122 Type 2 diabetes mellitus with diabetic chronic kidney disease: Secondary | ICD-10-CM

## 2015-08-11 LAB — BASIC METABOLIC PANEL
BUN: 21 mg/dL (ref 6–23)
CALCIUM: 9.3 mg/dL (ref 8.4–10.5)
CO2: 27 mEq/L (ref 19–32)
Chloride: 102 mEq/L (ref 96–112)
Creatinine, Ser: 1.08 mg/dL (ref 0.40–1.50)
GFR: 74.3 mL/min (ref >60.00)
Glucose, Bld: 158 mg/dL — ABNORMAL HIGH (ref 70–99)
Potassium: 3.7 mEq/L (ref 3.5–5.1)
SODIUM: 139 meq/L (ref 135–145)

## 2015-08-11 LAB — HEMOGLOBIN A1C: HEMOGLOBIN A1C: 8.2 % — AB (ref 4.6–6.5)

## 2015-08-11 LAB — MICROALBUMIN / CREATININE URINE RATIO
CREATININE, U: 529.8 mg/dL
Microalb Creat Ratio: 6.1 mg/g (ref 0.0–30.0)
Microalb, Ur: 32.2 mg/dL — ABNORMAL HIGH (ref 0.0–1.9)

## 2015-08-11 MED ORDER — LISINOPRIL-HYDROCHLOROTHIAZIDE 20-25 MG PO TABS: 1.0000 | ORAL_TABLET | Freq: Every day | ORAL | Status: DC

## 2015-08-11 MED ORDER — METFORMIN HCL 1000 MG PO TABS: ORAL_TABLET | ORAL | Status: DC

## 2015-08-11 NOTE — Progress Notes (Signed)
Pre visit review using our clinic review tool, if applicable. No additional management support is needed unless otherwise documented below in the visit note. 

## 2015-08-11 NOTE — Assessment & Plan Note (Signed)
Above goal last visit. We discussed diet, exercise and weight loss today. Obtain follow up A1C, bmet, urine microalbumin. Continue current meds.

## 2015-08-11 NOTE — Telephone Encounter (Signed)
Please let pt know that I reviewed his chart and see that he is due to follow up with Cardiology- Dr. Armanda Magic.

## 2015-08-11 NOTE — Assessment & Plan Note (Signed)
LDL at goal, continue statin. 

## 2015-08-11 NOTE — Patient Instructions (Signed)
Please complete lab work prior to leaving. Please try to add walking- with a goal to work up to 30 minutes or more 5 days a week.

## 2015-08-11 NOTE — Telephone Encounter (Signed)
Please let pt know that I reviewed lab work. Diabetes control has worsened. I would like him to see endocrinology.

## 2015-08-11 NOTE — Assessment & Plan Note (Signed)
BP stable on current medications. Continue same.  

## 2015-08-11 NOTE — Progress Notes (Signed)
Subjective:    Patient ID: Paul Yu, male    DOB: 03/27/1955, 60 y.o.   MRN: [id number]  HPI  Paul Yu is a 60 yr old male who presents today for follow up.  1) DM2- on januvia, metformin, actos.  180-190 in the evenings.  Denies hypoglycemia.   Lab Results  Component Value Date   HGBA1C 7.8* 05/05/2015   HGBA1C 8.4* 12/23/2014   HGBA1C 9.3* 05/24/2014   Lab Results  Component Value Date   MICROALBUR 13.6* 05/24/2014   LDLCALC 74 05/05/2015   CREATININE 0.97 05/05/2015  Reports that he eats boiled egs for breakfast.  Has breakfast bar, fruit water during the day Dinner- whatever is for supper. Not currently exercise.    2) HTN- currently maintained on amlodipine 61m, coreg 6.25, catapres, zestoretic. Denies CP of SOB.  BP Readings from Last 3 Encounters:  05/05/15 124/72  12/23/14 151/78  07/15/14 131/66   3) Hyperlipidemia- maintained on pravastatin. Denies unusual myalgias.  Lab Results  Component Value Date   CHOL 133 05/05/2015   HDL 34.70* 05/05/2015   LDLCALC 74 05/05/2015   LDLDIRECT 131.0 12/23/2014   TRIG 125.0 05/05/2015   CHOLHDL 4 05/05/2015    Review of Systems See HPI  Past Medical History  Diagnosis Date  . Diabetes mellitus without complication ([facility]      Social History   Social History  . Marital Status: Married    Spouse Name: N/A  . Number of Children: N/A  . Years of Education: N/A   Occupational History  . Not on file.   Social History Main Topics  . Smoking status: Never Smoker   . Smokeless tobacco: Never Used  . Alcohol Use: 0.0 oz/week    0 Standard drinks or equivalent per week     Comment: rarely,   . Drug Use: No  . Sexual Activity: Not on file   Other Topics Concern  . Not on file   Social History Narrative   4th marriage-    Daughter born 2002   Son 1982-[place]   Completed 9th grade- GED   Enjoys working on cars,         Past Surgical History    Procedure Laterality Date  . Tonsillectomy  1966  . Left heart catheterization with coronary angiogram N/A 07/15/2014    Procedure: LEFT HEART CATHETERIZATION WITH CORONARY ANGIOGRAM;  Surgeon: MSherren Mocha MD;  [facility];  Service: Cardiovascular;  Laterality: N/A;    Family History  Problem Relation Age of Onset  . Cancer Mother     ?lung  . Diabetes Maternal Grandmother   . Heart disease Neg Hx     Allergies  Allergen Reactions  . Atorvastatin Other (See Comments)    Muscle aches    Current Outpatient Prescriptions on File Prior to Visit  Medication Sig Dispense Refill  . amLODipine (NORVASC) 10 MG tablet TAKE 1 TABLET(10 MG) BY MOUTH DAILY 90 tablet 1  . aspirin EC 81 MG tablet Take 1 tablet (81 mg total) by mouth daily.    . Blood Glucose Monitoring Suppl (ACCU-CHEK AVIVA PLUS) W/DEVICE KIT Test blood sugar once daily. Dx Code: E11.9 1 kit 1  . carvedilol (COREG) 6.25 MG tablet TAKE 1 TABLET(6.25 MG) BY MOUTH TWICE DAILY 180 tablet 0  . cetirizine (ZYRTEC) 10 MG tablet Take 10 mg by mouth daily as needed for allergies.    .Marland Kitchen  cloNIDine (CATAPRES) 0.1 MG tablet TAKE 1 TABLET BY MOUTH THREE TIMES DAILY 90 tablet 1  . glucose blood (ACCU-CHEK AVIVA PLUS) test strip Test blood sugar once daily. Dx Code: E11.9 100 each 23  . JANUVIA 100 MG tablet TAKE 1 TABLET(100 MG) BY MOUTH DAILY 30 tablet 5  . lisinopril-hydrochlorothiazide (PRINZIDE,ZESTORETIC) 20-25 MG tablet TAKE 1 TABLET BY MOUTH DAILY 30 tablet 0  . metFORMIN (GLUCOPHAGE) 1000 MG tablet TAKE 1 TABLET(1000 MG) BY MOUTH TWICE DAILY WITH A MEAL 180 tablet 1  . ONETOUCH DELICA LANCETS FINE MISC Reported on 05/04/2015  11  . pioglitazone (ACTOS) 30 MG tablet Take 1 tablet (30 mg total) by mouth daily. 30 tablet 5  . pravastatin (PRAVACHOL) 20 MG tablet TAKE 1 TABLET(20 MG) BY MOUTH DAILY 30 tablet 5  . sildenafil (VIAGRA) 50 MG tablet Take 1 tablet (50 mg total) by mouth daily as needed for erectile dysfunction. 10  tablet 2   No current facility-administered medications on file prior to visit.    Pulse 69  Temp(Src) 98 F (36.7 C) (Oral)  Resp 16  Ht 5' 9"  (1.753 m)  Wt 217 lb 9.6 oz (98.703 kg)  BMI 32.12 kg/m2  SpO2 99%       Objective:   Physical Exam  Constitutional: He is oriented to person, place, and time. He appears well-developed and well-nourished. No distress.  HENT:  Head: Normocephalic and atraumatic.  Cardiovascular: Normal rate and regular rhythm.   No murmur heard. Pulmonary/Chest: Effort normal and breath sounds normal. No respiratory distress. He has no wheezes. He has no rales.  Musculoskeletal: He exhibits no edema.  Neurological: He is alert and oriented to person, place, and time.  Skin: Skin is warm and dry.  Hypertrophic toenails  Psychiatric: He has a normal mood and affect. His behavior is normal. Thought content normal.          Assessment & Plan:

## 2015-08-14 NOTE — Telephone Encounter (Signed)
Left detailed message on home # re: below information and to call if any questions.

## 2015-08-14 NOTE — Telephone Encounter (Signed)
Left message for pt to return my call.

## 2015-08-14 NOTE — Telephone Encounter (Signed)
Pt returned call

## 2015-08-18 NOTE — Telephone Encounter (Signed)
Please ask him to schedule a follow up appointment with her.

## 2015-08-18 NOTE — Telephone Encounter (Signed)
Notified pt and he voices understanding. 

## 2015-08-27 ENCOUNTER — Other Ambulatory Visit: Payer: Self-pay | Admitting: Family

## 2015-08-28 NOTE — Telephone Encounter (Signed)
Medication filled to pharmacy as requested.   

## 2015-08-30 ENCOUNTER — Other Ambulatory Visit: Payer: Self-pay | Admitting: Family

## 2015-09-22 ENCOUNTER — Encounter: Payer: Self-pay | Admitting: Endocrinology

## 2015-09-22 ENCOUNTER — Ambulatory Visit (INDEPENDENT_AMBULATORY_CARE_PROVIDER_SITE_OTHER): Payer: BLUE CROSS/BLUE SHIELD | Admitting: Endocrinology

## 2015-09-22 VITALS — BP 138/87 | HR 73 | Ht 69.0 in | Wt 216.0 lb

## 2015-09-22 DIAGNOSIS — E1165 Type 2 diabetes mellitus with hyperglycemia: Secondary | ICD-10-CM

## 2015-09-22 DIAGNOSIS — E785 Hyperlipidemia, unspecified: Secondary | ICD-10-CM

## 2015-09-22 DIAGNOSIS — R6882 Decreased libido: Secondary | ICD-10-CM

## 2015-09-22 DIAGNOSIS — I10 Essential (primary) hypertension: Secondary | ICD-10-CM | POA: Diagnosis not present

## 2015-09-22 MED ORDER — CANAGLIFLOZIN 100 MG PO TABS: ORAL_TABLET | ORAL | Status: DC

## 2015-09-22 NOTE — Patient Instructions (Signed)
Check blood sugars on waking up  2-3 times a week  Also check blood sugars about 2 hours after a meal and do this after different meals by rotation  Recommended blood sugar levels on waking up is 90-130 and about 2 hours after meal is 130-160  Please bring your blood sugar monitor to each visit, thank you  Walk daily  Stop actos  Cut Lisinopril in 1/2

## 2015-09-22 NOTE — Progress Notes (Signed)
Patient ID: Paul Yu, male   DOB: [birth year], 60 y.o.   MRN: [id number]           Reason for Appointment: Consultation for poorly controlled Type 2 Diabetes  Referring physician: Debbrah Alar   History of Present Illness:          Date of diagnosis of type 2 diabetes mellitus: 03/2014      Background history:   His blood sugars were significantly high with A1c 9.3 at the time of diagnosis He was initially treated with metformin and subsequently A1c improved but 6 was still over 8% he was given JANUVIA also in 10/16.  A1c improved slightly to 7.8 in February 2017 At this point Actos 30 mg was added  Recent history:   Non-insulin hypoglycemic drugs the patient is taking are: Januvia 100 mg daily, metformin 1 g twice a day, Actos 30 mg daily  Current management, blood sugar patterns and problems identified:  Since his blood sugars were not controlled on a 3 drug regimen including Actos with A1c of 8.2% in 5/17 he was referred here for further management  He has not had any diabetes education and no knowledge of meal planning  He says he has difficulty compliant with his diet including eating sweets like cakes and ice cream regularly.  His wife is doing most of the cooking and she is not available to give a history today and also has not followed any meal plan  He is also not exercising, has only some activity at work  Has not had any success with weight loss  Appears that with adding Actos his blood sugars are not any better, A1c higher at 8.2 now    He has not had a functioning glucose monitor for couple of months and has not looked into another one       Side effects from medications have been: None  Compliance with the medical regimen: Fair  Glucose monitoring:  not done, does not have a meter available, was using Accu-Chek  Self-care: The diet that the patient has been following is: tries to limit .     Meal times are:  Breakfast is at Lunch: Dinner:     Typical meal intake: Breakfast is boiled eggs and granola bar.  Fruits and vegetables at lunch, mixed meal at dinner        Cakes, ice cream for snacks or desserts        Dietician visit, most recent: Never               Exercise:  none  Weight history:203  Wt Readings from Last 3 Encounters:  09/22/15 216 lb (97.977 kg)  08/11/15 217 lb 9.6 oz (98.703 kg)  05/05/15 216 lb 12.8 oz (98.34 kg)    Glycemic control:   Lab Results  Component Value Date   HGBA1C 8.2* 08/11/2015   HGBA1C 7.8* 05/05/2015   HGBA1C 8.4* 12/23/2014   Lab Results  Component Value Date   MICROALBUR 32.2* 08/11/2015   LDLCALC 74 05/05/2015   CREATININE 1.08 08/11/2015   Lab Results  Component Value Date   MICRALBCREAT 6.1 08/11/2015         Medication List       This list is accurate as of: 09/22/15 11:59 PM.  Always use your most recent med list.               ACCU-CHEK AVIVA PLUS w/Device Kit  Test blood sugar once daily. Dx Code: E11.9  amLODipine 10 MG tablet  Commonly known as:  NORVASC  TAKE 1 TABLET(10 MG) BY MOUTH DAILY     aspirin EC 81 MG tablet  Take 1 tablet (81 mg total) by mouth daily.     canagliflozin 100 MG Tabs tablet  Commonly known as:  INVOKANA  1 tablet before breakfast     carvedilol 6.25 MG tablet  Commonly known as:  COREG  TAKE 1 TABLET(6.25 MG) BY MOUTH TWICE DAILY     cetirizine 10 MG tablet  Commonly known as:  ZYRTEC  Take 10 mg by mouth daily as needed for allergies.     cloNIDine 0.1 MG tablet  Commonly known as:  CATAPRES  TAKE 1 TABLET BY MOUTH THREE TIMES DAILY     glucose blood test strip  Commonly known as:  ACCU-CHEK AVIVA PLUS  Test blood sugar once daily. Dx Code: E11.9     JANUVIA 100 MG tablet  Generic drug:  sitaGLIPtin  TAKE 1 TABLET(100 MG) BY MOUTH DAILY     lisinopril-hydrochlorothiazide 20-25 MG tablet  Commonly known as:  PRINZIDE,ZESTORETIC  Take 1 tablet by mouth daily.     metFORMIN 1000 MG tablet  Commonly  known as:  GLUCOPHAGE  TAKE 1 TABLET(1000 MG) BY MOUTH TWICE DAILY WITH A MEAL     ONETOUCH DELICA LANCETS FINE Misc  Reported on 05/04/2015     pravastatin 20 MG tablet  Commonly known as:  PRAVACHOL  TAKE 1 TABLET(20 MG) BY MOUTH DAILY     sildenafil 50 MG tablet  Commonly known as:  VIAGRA  Take 1 tablet (50 mg total) by mouth daily as needed for erectile dysfunction.        Allergies:  Allergies  Allergen Reactions  . Atorvastatin Other (See Comments)    Muscle aches    Past Medical History  Diagnosis Date  . Diabetes mellitus without complication [facility])     Past Surgical History  Procedure Laterality Date  . Tonsillectomy  1966  . Left heart catheterization with coronary angiogram N/A 07/15/2014    Procedure: LEFT HEART CATHETERIZATION WITH CORONARY ANGIOGRAM;  Surgeon: Sherren Mocha, MD;  Location: [facility] CATH LAB;  Service: Cardiovascular;  Laterality: N/A;    Family History  Problem Relation Age of Onset  . Cancer Mother     ?lung  . Diabetes Maternal Grandmother   . Heart disease Neg Hx     Social History:  reports that he has never smoked. He has never used smokeless tobacco. He reports that he drinks alcohol. He reports that he does not use illicit drugs.   Review of Systems  Constitutional: Negative for weight loss and reduced appetite.  HENT: Negative for trouble swallowing.   Eyes: Negative for blurred vision.  Respiratory: Negative for shortness of breath.   Cardiovascular: Negative for chest pain and leg swelling.  Gastrointestinal: Negative for diarrhea.  Endocrine: Positive for fatigue, decreased libido and erectile dysfunction.       He does complain of decreased libido with variable frequency of erectile dysfunction.  Also generally feels tired.  No history of hypogonadism  Genitourinary: Negative for nocturia.  Musculoskeletal: Negative for joint pain and muscle cramps.  Skin: Negative for rash.  Neurological: Negative for numbness and  tingling.     Lipid history:     Lab Results  Component Value Date   CHOL 133 05/05/2015   HDL 34.70* 05/05/2015   LDLCALC 74 05/05/2015   LDLDIRECT 131.0 12/23/2014   TRIG 125.0 05/05/2015  CHOLHDL 4 05/05/2015           Hypertension:Controlled with clonidine, carvedilol and Zestoretic, has variable readings  BP Readings from Last 3 Encounters:  09/22/15 138/87  05/05/15 124/72  12/23/14 151/78    Most recent eye exam was ?  2014  Most recent foot exam: 6/17    LABS:  No visits with results within 1 Week(s) from this visit. Latest known visit with results is:  Office Visit on 08/11/2015  Component Date Value Ref Range Status  . Fecal Occult Blood 05/25/2015 Negative   Final  . Hgb A1c MFr Bld 08/11/2015 8.2* 4.6 - 6.5 % Final   Glycemic Control Guidelines for People with Diabetes:Non Diabetic:  <6%Goal of Therapy: <7%Additional Action Suggested:  >8%   . Sodium 08/11/2015 139  135 - 145 mEq/L Final  . Potassium 08/11/2015 3.7  3.5 - 5.1 mEq/L Final  . Chloride 08/11/2015 102  96 - 112 mEq/L Final  . CO2 08/11/2015 27  19 - 32 mEq/L Final  . Glucose, Bld 08/11/2015 158* 70 - 99 mg/dL Final  . BUN 08/11/2015 21  6 - 23 mg/dL Final  . Creatinine, Ser 08/11/2015 1.08  0.40 - 1.50 mg/dL Final  . Calcium 08/11/2015 9.3  8.4 - 10.5 mg/dL Final  . GFR 08/11/2015 74.30  >60.00 mL/min Final  . Microalb, Ur 08/11/2015 32.2* 0.0 - 1.9 mg/dL Final  . Creatinine,U 08/11/2015 529.8   Final  . Microalb Creat Ratio 08/11/2015 6.1  0.0 - 30.0 mg/g Final    Physical Examination:  BP 138/87 mmHg  Pulse 73  Ht 5' 9"  (1.753 m)  Wt 216 lb (97.977 kg)  BMI 31.88 kg/m2  SpO2 96%  GENERAL:         Patient has generalized obesity.   HEENT:         Eye exam shows normal external appearance. Fundus exam shows no retinopathy. Oral exam shows normal mucosa .  NECK:   There is no lymphadenopathy Thyroid is not enlarged and no nodules felt.  Carotids are normal to palpation and no  bruit heard LUNGS:         Chest is symmetrical. Lungs are clear to auscultation.Marland Kitchen   HEART:         Heart sounds:  S1 and S2 are normal. No murmur or click heard., no S3 or S4.   ABDOMEN:   Abdominal obesity present There is no distention present. Liver and spleen are not palpable. No other mass or tenderness present.   NEUROLOGICAL:   Ankle jerks are absent bilaterally.    Diabetic Foot Exam - Simple   Simple Foot Form  Diabetic Foot exam was performed with the following findings:  Yes 09/22/2015 11:58 AM  Visual Inspection  No deformities, no ulcerations, no other skin breakdown bilaterally:  Yes  See comments:  Yes  Sensation Testing  Intact to touch and monofilament testing bilaterally:  Yes  Pulse Check  Posterior Tibialis and Dorsalis pulse intact bilaterally:  Yes  Comments  Calluses present             Vibration sense is Mildly reduced in distal first toes. MUSCULOSKELETAL:  There is no swelling or deformity of the peripheral joints. Spine is normal to inspection.   EXTREMITIES:     There is no edema. No skin lesions present. SKIN:       No rash or lesions of concern.        ASSESSMENT:  Diabetes type 2, uncontrolled diagnosed  in 2016   Most recent A1c 8.2  With a 3 drug regimen of metformin, Actos and Januvia his blood sugars are not well controlled with A1c over 8% He has minimal diabetes and meal planning knowledge Currently doing poorly with diet and can do significantly better with portions and snacks like sweets Also not exercising He is currently very reluctant to consider any injectable medication for his diabetes  Complications of diabetes: Erectile dysfunction, has no evidence of retinopathy, nephropathy or neuropathy clinically Overdue for eye exam  HYPERTENSION: Variable control, high blood pressure today  Lipids well controlled with pravastatin 20 mg daily  Decreased libido, fatigue and erectile dysfunction: Needs to be evaluated for hypogonadism due to  his diabetes and insulin resistance  PLAN:   Discussed action of SGLT 2 drugs on lowering glucose by decreasing kidney absorption of glucose, benefits of weight loss and lower blood pressure, possible side effects including candidiasis and dosage regimen   He will start Invokana 100 mg daily in the morning With starting Invokana he will reduce his lisinopril in half and will need follow-up renal and electrolyte evaluation  Topic Actos as it has not helped his glucose control  He needs to check with his insurance about the coverage for glucose testing supplies since difficult to find a covered brand on the formulary check today  Discussed checking blood sugars alternating fasting and 2 hours after meals and also discussed blood sugar targets  He needs to start a walking program daily  Consultation with dietitian.  He will also go with his wife for this  Continue Januvia and metformin  He also was told about the option for using a GLP-1 drug which would enable weight loss as well as controlling his readings for carbohydrates and sweets, he is not wanting to do this now  Follow-up in 4 weeks with fasting level of free testosterone and fructosamine  Patient Instructions  Check blood sugars on waking up  2-3 times a week  Also check blood sugars about 2 hours after a meal and do this after different meals by rotation  Recommended blood sugar levels on waking up is 90-130 and about 2 hours after meal is 130-160  Please bring your blood sugar monitor to each visit, thank you  Walk daily  Stop actos  Cut Lisinopril in 1/2    Counseling time on subjects discussed above is over 50% of today's 60 minute visit  Paul Yu 09/24/2015, 3:38 PM   Note: This office note was prepared with Dragon voice recognition system technology. Any transcriptional errors that result from this process are unintentional.

## 2015-10-13 LAB — HM DIABETES EYE EXAM

## 2015-10-16 ENCOUNTER — Other Ambulatory Visit: Payer: Self-pay | Admitting: Cardiology

## 2015-10-18 ENCOUNTER — Encounter: Payer: Self-pay | Admitting: Endocrinology

## 2015-10-18 ENCOUNTER — Other Ambulatory Visit (INDEPENDENT_AMBULATORY_CARE_PROVIDER_SITE_OTHER): Payer: BLUE CROSS/BLUE SHIELD

## 2015-10-18 DIAGNOSIS — E1165 Type 2 diabetes mellitus with hyperglycemia: Secondary | ICD-10-CM

## 2015-10-18 DIAGNOSIS — R6882 Decreased libido: Secondary | ICD-10-CM | POA: Diagnosis not present

## 2015-10-19 LAB — BASIC METABOLIC PANEL
BUN: 23 mg/dL (ref 6–23)
CALCIUM: 9.7 mg/dL (ref 8.4–10.5)
CO2: 26 meq/L (ref 19–32)
Chloride: 98 mEq/L (ref 96–112)
Creatinine, Ser: 1.26 mg/dL (ref 0.40–1.50)
GFR: 62.15 mL/min (ref >60.00)
GLUCOSE: 124 mg/dL — AB (ref 70–99)
POTASSIUM: 3.6 meq/L (ref 3.5–5.1)
SODIUM: 138 meq/L (ref 135–145)

## 2015-10-19 LAB — LUTEINIZING HORMONE: LH: 3.58 m[IU]/mL (ref 1.50–9.30)

## 2015-10-20 ENCOUNTER — Encounter: Payer: Self-pay | Admitting: Endocrinology

## 2015-10-20 ENCOUNTER — Ambulatory Visit (INDEPENDENT_AMBULATORY_CARE_PROVIDER_SITE_OTHER): Payer: BLUE CROSS/BLUE SHIELD | Admitting: Endocrinology

## 2015-10-20 VITALS — BP 144/84 | HR 72 | Wt 217.0 lb

## 2015-10-20 DIAGNOSIS — R6882 Decreased libido: Secondary | ICD-10-CM | POA: Diagnosis not present

## 2015-10-20 DIAGNOSIS — E1165 Type 2 diabetes mellitus with hyperglycemia: Secondary | ICD-10-CM

## 2015-10-20 DIAGNOSIS — I10 Essential (primary) hypertension: Secondary | ICD-10-CM

## 2015-10-20 LAB — FRUCTOSAMINE: FRUCTOSAMINE: 311 umol/L — AB (ref 190–270)

## 2015-10-20 NOTE — Patient Instructions (Addendum)
Start walking 30 min daily  Check blood sugars on waking up  2-3 times a week Also check blood sugars about 2 hours after a meal and do this after different meals by rotation  Recommended blood sugar levels on waking up is 90-130 and about 2 hours after meal is 130-160  Please bring your blood sugar monitor to each visit, thank you

## 2015-10-20 NOTE — Progress Notes (Signed)
Patient ID: Paul Yu, male   DOB: December 23, 1955, 60 y.o.   MRN: [id number]           Reason for Appointment:  Type 2 Diabetes, follow-up  Referring physician: Debbrah Alar   History of Present Illness:          Date of diagnosis of type 2 diabetes mellitus: 03/2014      Background history:   His blood sugars were significantly high with A1c 9.3 at the time of diagnosis He was initially treated with metformin and subsequently A1c improved but 6 was still over 8% he was given JANUVIA also in 10/16.  A1c improved slightly to 7.8 in February 2017 At this point Actos 30 mg was added  Recent history:   Non-insulin hypoglycemic drugs the patient is taking are: Januvia 100 mg daily, metformin 1 g twice a day, Actos 30 mg daily, Invokana 100 mg  Current management, blood sugar patterns and problems identified:  Since his blood sugars were not controlled on a 3 drug regimen including Actos with A1c of 8.2% in 5/17 he was started on Invokana Actos was stopped because of lack of efficacy  Also was supposed to see the dietitian but has not made an appointment.  He thinks he does not need this but is not able to follow his diet with eating larger portions and sweets  He is also not exercising, has only some activity at work, has not started any program as discussed  Has not had any success with weight loss, weight is not improved even with Invokana  His blood sugars are overall appearing better although FRUCTOSAMINE is still high at 311  He has just started using a new Contour glucose meter and has only one reading of 201 this afternoon after eating pizza       Side effects from medications have been: None  Compliance with the medical regimen: Fair  Glucose monitoring:  not done   Self-care: The diet that the patient has been following is: None, avoiding drinks with sugar      Meal times are:  Dinner: At 7 pm   Typical meal intake: Breakfast is boiled eggs and granola bar.   Fruits and vegetables at lunch, mixed meal at dinner         Some ice cream regularly         Dietician visit, most recent: Never               Exercise:  none  Weight history:  Wt Readings from Last 3 Encounters:  10/20/15 217 lb (98.4 kg)  09/22/15 216 lb (98 kg)  08/11/15 217 lb 9.6 oz (98.7 kg)    Glycemic control:   Lab Results  Component Value Date   HGBA1C 8.2 (H) 08/11/2015   HGBA1C 7.8 (H) 05/05/2015   HGBA1C 8.4 (H) 12/23/2014   Lab Results  Component Value Date   MICROALBUR 32.2 (H) 08/11/2015   LDLCALC 74 05/05/2015   CREATININE 1.26 10/18/2015   Lab Results  Component Value Date   MICRALBCREAT 6.1 08/11/2015    Lab on 10/18/2015  Component Date Value Ref Range Status  . Sodium 10/19/2015 138  135 - 145 mEq/L Final  . Potassium 10/19/2015 3.6  3.5 - 5.1 mEq/L Final  . Chloride 10/19/2015 98  96 - 112 mEq/L Final  . CO2 10/19/2015 26  19 - 32 mEq/L Final  . Glucose, Bld 10/19/2015 124* 70 - 99 mg/dL Final  . BUN 10/19/2015 23  6 - 23 mg/dL Final  . Creatinine, Ser 10/19/2015 1.26  0.40 - 1.50 mg/dL Final  . Calcium 10/19/2015 9.7  8.4 - 10.5 mg/dL Final  . GFR 10/19/2015 62.15  >60.00 mL/min Final  . Fructosamine 10/20/2015 311* 190 - 270 umol/L Final  . LH 10/19/2015 3.58  1.50 - 9.30 mIU/mL Final   Comment: Male Reference Range:20-70 yrs     1.5-9.3 mIU/mL>70 yrs       3.1-35.6 mIU/mLFemale Reference Range:Follicular Phase     1.0-62.6 mIU/mLMidcycle             8.7-76.3 mIU/mLLuteal Phase         0.5-16.9 mIU/mL  Post Menopausal      15.9-54.0  mIU/mLPregnant             <1.5 mIU/mLContraceptives       0.7-5.6 mIU/mL         Medication List       Accurate as of 10/20/15 11:59 PM. Always use your most recent med list.          ACCU-CHEK AVIVA PLUS w/Device Kit Test blood sugar once daily. Dx Code: E11.9   amLODipine 10 MG tablet Commonly known as:  NORVASC TAKE 1 TABLET(10 MG) BY MOUTH DAILY   aspirin EC 81 MG tablet Take 1 tablet (81  mg total) by mouth daily.   canagliflozin 100 MG Tabs tablet Commonly known as:  INVOKANA 1 tablet before breakfast   carvedilol 6.25 MG tablet Commonly known as:  COREG TAKE 1 TABLET BY MOUTH TWICE DAILY. PATIENT NEEDS TO SCHEDULE A FOLLOW UP APPOINTMENT.   cetirizine 10 MG tablet Commonly known as:  ZYRTEC Take 10 mg by mouth daily as needed for allergies.   cloNIDine 0.1 MG tablet Commonly known as:  CATAPRES TAKE 1 TABLET BY MOUTH THREE TIMES DAILY   glucose blood test strip Commonly known as:  ACCU-CHEK AVIVA PLUS Test blood sugar once daily. Dx Code: E11.9   JANUVIA 100 MG tablet Generic drug:  sitaGLIPtin TAKE 1 TABLET(100 MG) BY MOUTH DAILY   lisinopril-hydrochlorothiazide 20-25 MG tablet Commonly known as:  PRINZIDE,ZESTORETIC Take 1 tablet by mouth daily.   metFORMIN 1000 MG tablet Commonly known as:  GLUCOPHAGE TAKE 1 TABLET(1000 MG) BY MOUTH TWICE DAILY WITH A MEAL   ONETOUCH DELICA LANCETS FINE Misc Reported on 05/04/2015   pravastatin 20 MG tablet Commonly known as:  PRAVACHOL TAKE 1 TABLET(20 MG) BY MOUTH DAILY   sildenafil 50 MG tablet Commonly known as:  VIAGRA Take 1 tablet (50 mg total) by mouth daily as needed for erectile dysfunction.       Allergies:  Allergies  Allergen Reactions  . Atorvastatin Other (See Comments)    Muscle aches    Past Medical History:  Diagnosis Date  . Diabetes mellitus without complication [facility])     Past Surgical History:  Procedure Laterality Date  . LEFT HEART CATHETERIZATION WITH CORONARY ANGIOGRAM N/A 07/15/2014   Procedure: LEFT HEART CATHETERIZATION WITH CORONARY ANGIOGRAM;  Surgeon: Sherren Mocha, MD;  Location[facility] C[facility] LAB;  Service: Cardiovascular;  Laterality: N/A;  . TONSILLECTOMY  1966    Family History  Problem Relation Age of Onset  . Cancer Mother     ?lung  . Diabetes Maternal Grandmother   . Heart disease Neg Hx     Social History:  reports that he has never smoked. He has never  used smokeless tobacco. He reports that he drinks alcohol. He reports that he does not use  drugs.   Review of Systems    Lipid history: He is being treated with pravastatin 20 mg    Lab Results  Component Value Date   CHOL 133 05/05/2015   HDL 34.70 (L) 05/05/2015   LDLCALC 74 05/05/2015   LDLDIRECT 131.0 12/23/2014   TRIG 125.0 05/05/2015   CHOLHDL 4 05/05/2015           Hypertension:Controlled with clonidine, carvedilol and Zestoretic, has variable readings Recently has not filled his medications consistently and blood pressure is higher despite starting Invokana   BP Readings from Last 3 Encounters:  10/20/15 (!) 144/84  09/22/15 138/87  05/05/15 124/72    Most recent eye exam was 7/17  Most recent foot exam: 6/17  He is being evaluated for fatigue and decreased libido and testosterone level is still pending Will also need to add prolactin level   LABS:  Lab on 10/18/2015  Component Date Value Ref Range Status  . Sodium 10/19/2015 138  135 - 145 mEq/L Final  . Potassium 10/19/2015 3.6  3.5 - 5.1 mEq/L Final  . Chloride 10/19/2015 98  96 - 112 mEq/L Final  . CO2 10/19/2015 26  19 - 32 mEq/L Final  . Glucose, Bld 10/19/2015 124* 70 - 99 mg/dL Final  . BUN 10/19/2015 23  6 - 23 mg/dL Final  . Creatinine, Ser 10/19/2015 1.26  0.40 - 1.50 mg/dL Final  . Calcium 10/19/2015 9.7  8.4 - 10.5 mg/dL Final  . GFR 10/19/2015 62.15  >60.00 mL/min Final  . Fructosamine 10/20/2015 311* 190 - 270 umol/L Final  . LH 10/19/2015 3.58  1.50 - 9.30 mIU/mL Final   Comment: Male Reference Range:20-70 yrs     1.5-9.3 mIU/mL>70 yrs       3.1-35.6 mIU/mLFemale Reference Range:Follicular Phase     8.3-38.2 mIU/mLMidcycle             8.7-76.3 mIU/mLLuteal Phase         0.5-16.9 mIU/mL  Post Menopausal      15.9-54.0  mIU/mLPregnant             <1.5 mIU/mLContraceptives       0.7-5.6 mIU/mL     Physical Examination:  BP (!) 144/84   Pulse 72   Wt 217 lb (98.4 kg)   SpO2 97%   BMI  32.05 kg/m           ASSESSMENT:  Diabetes type 2, uncontrolled diagnosed in 2016   Most recent A1c 8.2, Fructosamine now 311  He has had some improvement from adding Invokana to his regimen of Januvia and metformin However he can make significant changes in his diet and will schedule him with the dietitian.  He is not able to comply with meal planning Again he is reluctant to take a GLP-1 drug or weight loss medication that will reduce his appetite He also can do better with exercise Weight has not improved even with Invokana  HYPERTENSION: Variable control, may improve with his being regular with his medications, may need to be increasing Invokana  PLAN:     No change in medication regimen as yet  Started improving diet.  Discussed watching portions, calories and types of foods  He may do better with seeing results of high readings after meals, discussed timing of glucose monitoring  Discussed blood sugar targets at various times  Consider increasing Invokana on the next visit  Also may be a candidate for once a week GLP-1 drug, he is very reluctant to  do injectable drugs currently  Start walking on his days off at least  Review reports of testosterone when available, consider clomiphene as LH level is normal along with prolactin  Patient Instructions  Start walking 30 min daily  Check blood sugars on waking up  2-3 times a week Also check blood sugars about 2 hours after a meal and do this after different meals by rotation  Recommended blood sugar levels on waking up is 90-130 and about 2 hours after meal is 130-160  Please bring your blood sugar monitor to each visit, thank you   Counseling time on subjects discussed above is over 50% of today's 25 minute visit  Arslan Kier 10/22/2015, 3:43 PM   Note: This office note was prepared with Dragon voice recognition system technology. Any transcriptional errors that result from this process are unintentional.

## 2015-10-23 ENCOUNTER — Other Ambulatory Visit: Payer: Self-pay

## 2015-10-30 ENCOUNTER — Telehealth: Payer: Self-pay | Admitting: Endocrinology

## 2015-10-30 MED ORDER — GLUCOSE BLOOD VI STRP
ORAL_STRIP | 23 refills | Status: DC
Start: 2015-10-30 — End: 2017-10-24

## 2015-10-30 NOTE — Telephone Encounter (Signed)
Rx submitted per pt's request.  

## 2015-10-30 NOTE — Telephone Encounter (Signed)
Pt needs test strips called to [facility] please

## 2015-11-03 ENCOUNTER — Ambulatory Visit (INDEPENDENT_AMBULATORY_CARE_PROVIDER_SITE_OTHER): Payer: BLUE CROSS/BLUE SHIELD | Admitting: Family

## 2015-11-03 ENCOUNTER — Encounter: Payer: Self-pay | Admitting: Family

## 2015-11-03 ENCOUNTER — Ambulatory Visit: Payer: BLUE CROSS/BLUE SHIELD | Admitting: Family

## 2015-11-03 DIAGNOSIS — I10 Essential (primary) hypertension: Secondary | ICD-10-CM

## 2015-11-03 DIAGNOSIS — R9431 Abnormal electrocardiogram [ECG] [EKG]: Secondary | ICD-10-CM

## 2015-11-03 DIAGNOSIS — E1121 Type 2 diabetes mellitus with diabetic nephropathy: Secondary | ICD-10-CM

## 2015-11-03 NOTE — Progress Notes (Signed)
Subjective:    Patient ID: Paul Yu, male    DOB: April 28, 1955, 60 y.o.   MRN: [id number]  HPI  Mr. Jaworski is a 60 yr old male who presents today for follow up.  1) DM2- maintained on invokana, metformin, januvia.  Reports that AM sugars usually in the low 100's. After meals can be as high as 250.  He denies hypoglycemia.  Diet is fair, trying to eat more veggies. Lab Results  Component Value Date   HGBA1C 8.2 (H) 08/11/2015   HGBA1C 7.8 (H) 05/05/2015   HGBA1C 8.4 (H) 12/23/2014   Lab Results  Component Value Date   MICROALBUR 32.2 (H) 08/11/2015   LDLCALC 74 05/05/2015   CREATININE 1.26 10/18/2015   2) HTN- amlodipine, coreg, zestoretic, clonidine.  BP Readings from Last 3 Encounters:  11/03/15 110/78  10/20/15 (!) 144/84  09/22/15 138/87   3)Hyperlipidemia- maintained on pravastatin.  Reports improvement in the muscle aching that he was having.   Lab Results  Component Value Date   CHOL 133 05/05/2015   HDL 34.70 (L) 05/05/2015   LDLCALC 74 05/05/2015   LDLDIRECT 131.0 12/23/2014   TRIG 125.0 05/05/2015   CHOLHDL 4 05/05/2015     Review of Systems    see HPI  Past Medical History:  Diagnosis Date  . Diabetes mellitus without complication [facility])      Social History   Social History  . Marital status: Married    Spouse name: N/A  . Number of children: N/A  . Years of education: N/A   Occupational History  . Not on file.   Social History Main Topics  . Smoking status: Never Smoker  . Smokeless tobacco: Never Used  . Alcohol use 0.0 oz/week     Comment: rarely,   . Drug use: No  . Sexual activity: Not on file   Other Topics Concern  . Not on file   Social History Narrative   4th marriage-    Daughter born 2002   Son 1982-lives in [place]- in [place] a truck   Completed 9th grade- GED   Enjoys working on cars,         Past Surgical History:  Procedure Laterality Date  . LEFT HEART CATHETERIZATION WITH CORONARY  ANGIOGRAM N/A 07/15/2014   Procedure: LEFT HEART CATHETERIZATION WITH CORONARY ANGIOGRAM;  Surgeon: Sherren Mocha, MD;  Location: [facility] CATH LAB;  Service: Cardiovascular;  Laterality: N/A;  . TONSILLECTOMY  1966    Family History  Problem Relation Age of Onset  . Cancer Mother     ?lung  . Diabetes Maternal Grandmother   . Heart disease Neg Hx     Allergies  Allergen Reactions  . Atorvastatin Other (See Comments)    Muscle aches    Current Outpatient Prescriptions on File Prior to Visit  Medication Sig Dispense Refill  . amLODipine (NORVASC) 10 MG tablet TAKE 1 TABLET(10 MG) BY MOUTH DAILY 90 tablet 1  . aspirin EC 81 MG tablet Take 1 tablet (81 mg total) by mouth daily.    . Blood Glucose Monitoring Suppl (ACCU-CHEK AVIVA PLUS) W/DEVICE KIT Test blood sugar once daily. Dx Code: E11.9 1 kit 1  . canagliflozin (INVOKANA) 100 MG TABS tablet 1 tablet before breakfast 30 tablet 3  . carvedilol (COREG) 6.25 MG tablet TAKE 1 TABLET BY MOUTH TWICE DAILY. PATIENT NEEDS TO SCHEDULE A FOLLOW UP APPOINTMENT. 120 tablet 0  . cetirizine (ZYRTEC) 10 MG tablet Take  10 mg by mouth daily as needed for allergies.    . cloNIDine (CATAPRES) 0.1 MG tablet TAKE 1 TABLET BY MOUTH THREE TIMES DAILY 90 tablet 1  . glucose blood (ACCU-CHEK AVIVA PLUS) test strip Test blood sugar once daily. Dx Code: E11.9 100 each 23  . JANUVIA 100 MG tablet TAKE 1 TABLET(100 MG) BY MOUTH DAILY 30 tablet 5  . lisinopril-hydrochlorothiazide (PRINZIDE,ZESTORETIC) 20-25 MG tablet Take 1 tablet by mouth daily. 30 tablet 5  . metFORMIN (GLUCOPHAGE) 1000 MG tablet TAKE 1 TABLET(1000 MG) BY MOUTH TWICE DAILY WITH A MEAL 180 tablet 1  . ONETOUCH DELICA LANCETS FINE MISC Reported on 05/04/2015  11  . pravastatin (PRAVACHOL) 20 MG tablet TAKE 1 TABLET(20 MG) BY MOUTH DAILY 30 tablet 5  . sildenafil (VIAGRA) 50 MG tablet Take 1 tablet (50 mg total) by mouth daily as needed for erectile dysfunction. 10 tablet 2   No current  facility-administered medications on file prior to visit.     BP 110/78   Pulse 66   Temp 98 F (36.7 C) (Oral)   Resp 18   Ht 5' 9"  (1.753 m)   Wt 219 lb 6.4 oz (99.5 kg)   SpO2 98% Comment: room air  BMI 32.40 kg/m    Objective:   Physical Exam  Constitutional: He is oriented to person, place, and time. He appears well-developed and well-nourished. No distress.  HENT:  Head: Normocephalic and atraumatic.  Cardiovascular: Normal rate and regular rhythm.   No murmur heard. Pulmonary/Chest: Effort normal and breath sounds normal. No respiratory distress. He has no wheezes. He has no rales.  Musculoskeletal: He exhibits no edema.  Neurological: He is alert and oriented to person, place, and time.  Skin: Skin is warm and dry.  Psychiatric: He has a normal mood and affect. His behavior is normal. Thought content normal.          Assessment & Plan:

## 2015-11-03 NOTE — Patient Instructions (Signed)
Please complete lab work prior to leaving.   

## 2015-11-03 NOTE — Progress Notes (Signed)
Pre visit review using our clinic review tool, if applicable. No additional management support is needed unless otherwise documented below in the visit note. 

## 2015-11-05 NOTE — Assessment & Plan Note (Signed)
LDL at goal, continus pravastatin.

## 2015-11-05 NOTE — Assessment & Plan Note (Signed)
BP is stable on current medications. Continue same.  

## 2015-11-05 NOTE — Assessment & Plan Note (Signed)
Lab Results  Component Value Date   HGBA1C 8.2 (H) 08/11/2015   Uncontrolled.  Pt is following with endocrinology.

## 2015-12-04 ENCOUNTER — Encounter: Payer: Self-pay | Admitting: *Deleted

## 2015-12-11 ENCOUNTER — Other Ambulatory Visit: Payer: Self-pay | Admitting: Family

## 2015-12-29 ENCOUNTER — Ambulatory Visit: Payer: BLUE CROSS/BLUE SHIELD | Admitting: Endocrinology

## 2016-01-01 ENCOUNTER — Other Ambulatory Visit: Payer: Self-pay | Admitting: Family

## 2016-01-02 MED ORDER — SITAGLIPTIN PHOSPHATE 100 MG PO TABS: ORAL_TABLET | ORAL | 2 refills | Status: DC

## 2016-01-09 ENCOUNTER — Other Ambulatory Visit: Payer: Self-pay | Admitting: Cardiology

## 2016-01-15 ENCOUNTER — Other Ambulatory Visit: Payer: Self-pay | Admitting: Family

## 2016-01-31 ENCOUNTER — Other Ambulatory Visit: Payer: Self-pay | Admitting: Endocrinology

## 2016-02-14 ENCOUNTER — Other Ambulatory Visit: Payer: Self-pay | Admitting: Family

## 2016-02-14 ENCOUNTER — Other Ambulatory Visit: Payer: Self-pay | Admitting: Cardiology

## 2016-02-18 ENCOUNTER — Other Ambulatory Visit: Payer: Self-pay | Admitting: Family

## 2016-02-19 ENCOUNTER — Encounter: Payer: Self-pay | Admitting: *Deleted

## 2016-02-19 ENCOUNTER — Telehealth: Payer: Self-pay | Admitting: *Deleted

## 2016-02-19 MED ORDER — TADALAFIL 10 MG PO TABS: 10.0000 mg | ORAL_TABLET | Freq: Every day | ORAL | 5 refills | Status: DC | PRN

## 2016-02-19 NOTE — Telephone Encounter (Signed)
Cialis prior auth sent to [employer] via covermymeds. Notified pt.  Awaiting determination.

## 2016-02-19 NOTE — Telephone Encounter (Signed)
Received fax from [facility] that Viagra requires prioir auth. Spoke with [place] at [facility] [phone]) and was told that Viagra is non-formulary. Cialis is preferred but will still require prior auth.  Please advise?

## 2016-02-19 NOTE — Telephone Encounter (Signed)
rx sent for cialis.  

## 2016-02-20 NOTE — Telephone Encounter (Signed)
Received approval for Cialis. Notified Iantha Fallen at [employer] and left detailed message for pt.

## 2016-03-04 ENCOUNTER — Other Ambulatory Visit: Payer: Self-pay | Admitting: Endocrinology

## 2016-03-19 ENCOUNTER — Other Ambulatory Visit: Payer: Self-pay | Admitting: Family

## 2016-03-27 ENCOUNTER — Other Ambulatory Visit: Payer: Self-pay | Admitting: Cardiology

## 2016-03-28 ENCOUNTER — Other Ambulatory Visit: Payer: Self-pay | Admitting: *Deleted

## 2016-03-28 MED ORDER — CARVEDILOL 6.25 MG PO TABS: 6.2500 mg | ORAL_TABLET | Freq: Two times a day (BID) | ORAL | 0 refills | Status: DC

## 2016-04-08 ENCOUNTER — Other Ambulatory Visit: Payer: Self-pay | Admitting: Endocrinology

## 2016-04-30 ENCOUNTER — Other Ambulatory Visit: Payer: Self-pay | Admitting: Cardiology

## 2016-05-02 NOTE — Telephone Encounter (Signed)
Patient has not been seen since 07/05/14 and his last two refills have only been for a two week supply with a note to call and schedule an appointment for further refills. Patient has not scheduled. Please advise on refill request. Thanks, MI

## 2016-05-03 ENCOUNTER — Encounter: Payer: Self-pay | Admitting: Family

## 2016-05-03 ENCOUNTER — Ambulatory Visit (INDEPENDENT_AMBULATORY_CARE_PROVIDER_SITE_OTHER): Payer: BLUE CROSS/BLUE SHIELD | Admitting: Family

## 2016-05-03 VITALS — BP 170/63 | HR 71 | Temp 97.9°F | Resp 17 | Ht 69.0 in | Wt 206.0 lb

## 2016-05-03 DIAGNOSIS — I420 Dilated cardiomyopathy: Secondary | ICD-10-CM

## 2016-05-03 DIAGNOSIS — I428 Other cardiomyopathies: Secondary | ICD-10-CM | POA: Diagnosis not present

## 2016-05-03 DIAGNOSIS — E785 Hyperlipidemia, unspecified: Secondary | ICD-10-CM

## 2016-05-03 DIAGNOSIS — E1165 Type 2 diabetes mellitus with hyperglycemia: Secondary | ICD-10-CM | POA: Diagnosis not present

## 2016-05-03 DIAGNOSIS — E1129 Type 2 diabetes mellitus with other diabetic kidney complication: Secondary | ICD-10-CM

## 2016-05-03 DIAGNOSIS — I10 Essential (primary) hypertension: Secondary | ICD-10-CM

## 2016-05-03 DIAGNOSIS — R809 Proteinuria, unspecified: Secondary | ICD-10-CM

## 2016-05-03 DIAGNOSIS — E1121 Type 2 diabetes mellitus with diabetic nephropathy: Secondary | ICD-10-CM

## 2016-05-03 DIAGNOSIS — IMO0002 Reserved for concepts with insufficient information to code with codable children: Secondary | ICD-10-CM

## 2016-05-03 LAB — BASIC METABOLIC PANEL
BUN: 21 mg/dL (ref 6–23)
CHLORIDE: 98 meq/L (ref 96–112)
CO2: 28 meq/L (ref 19–32)
CREATININE: 1.3 mg/dL (ref 0.40–1.50)
Calcium: 9.3 mg/dL (ref 8.4–10.5)
GFR: 59.84 mL/min — ABNORMAL LOW (ref >60.00)
GLUCOSE: 165 mg/dL — AB (ref 70–99)
Potassium: 3.7 mEq/L (ref 3.5–5.1)
Sodium: 136 mEq/L (ref 135–145)

## 2016-05-03 LAB — LIPID PANEL
CHOL/HDL RATIO: 4
Cholesterol: 177 mg/dL (ref 0–200)
HDL: 43.6 mg/dL (ref >39.00)
LDL CALC: 108 mg/dL — AB (ref 0–99)
NONHDL: 133.74
TRIGLYCERIDES: 129 mg/dL (ref 0.0–149.0)
VLDL: 25.8 mg/dL (ref 0.0–40.0)

## 2016-05-03 LAB — HEMOGLOBIN A1C: HEMOGLOBIN A1C: 8 % — AB (ref 4.6–6.5)

## 2016-05-03 MED ORDER — CARVEDILOL 6.25 MG PO TABS: 6.2500 mg | ORAL_TABLET | Freq: Two times a day (BID) | ORAL | 3 refills | Status: DC

## 2016-05-03 NOTE — Progress Notes (Signed)
Pre visit review using our clinic review tool, if applicable. No additional management support is needed unless otherwise documented below in the visit note. 

## 2016-05-03 NOTE — Patient Instructions (Signed)
Please complete lab work prior to leaving. Schedule follow up with Dr. Lucianne Muss and Dr. Mayford Knife.

## 2016-05-03 NOTE — Assessment & Plan Note (Signed)
Uncontrolled.  Resume. Carvedilol.

## 2016-05-03 NOTE — Progress Notes (Signed)
Subjective:    Patient ID: Paul Yu, male    DOB: 1955/08/23, 61 y.o.   MRN: [id number]  HPI  Paul Yu is a 61 yr old male who presents today for follow up.  1) DM2-  Reports that sugars run 150-200.  Patient is currently maintained on metformin, invokana, januvia. Reports that he does not exercise and eats "way too may sweets."  Lab Results  Component Value Date   HGBA1C 8.2 (H) 08/11/2015   HGBA1C 7.8 (H) 05/05/2015   HGBA1C 8.4 (H) 12/23/2014   Lab Results  Component Value Date   MICROALBUR 32.2 (H) 08/11/2015   LDLCALC 74 05/05/2015   CREATININE 1.26 10/18/2015   2) HTN- reports that he has not been taking carvedilol because he ran out and cardiology denied refill pending OV.  BP Readings from Last 3 Encounters:  05/03/16 (!) 170/63  11/03/15 110/78  10/20/15 (!) 144/84   Hx of dilated cardiomyopathy- felt per Dr. Burt Knack back in 2016 to be non-ischemic.  He had mild-moderate diffuse nonobstructive CAD on cardiac cath back in 4/22.  He denies CP/SOB or swelling.    Review of Systems    see HPI  Past Medical History:  Diagnosis Date  . Diabetes mellitus without complication [facility])      Social History   Social History  . Marital status: Married    Spouse name: N/A  . Number of children: N/A  . Years of education: N/A   Occupational History  . Not on file.   Social History Main Topics  . Smoking status: Never Smoker  . Smokeless tobacco: Never Used  . Alcohol use 0.0 oz/week     Comment: rarely,   . Drug use: No  . Sexual activity: Not on file   Other Topics Concern  . Not on file   Social History Narrative   4th marriage-    Daughter born 2002   Son 1982-lives in [place]- in [place] a truck   Completed 9th grade- GED   Enjoys working on cars,         Past Surgical History:  Procedure Laterality Date  . LEFT HEART CATHETERIZATION WITH CORONARY ANGIOGRAM N/A 07/15/2014   Procedure: LEFT HEART CATHETERIZATION WITH  CORONARY ANGIOGRAM;  Surgeon: Sherren Mocha, MD;  Location: [facility] CATH LAB;  Service: Cardiovascular;  Laterality: N/A;  . TONSILLECTOMY  1966    Family History  Problem Relation Age of Onset  . Cancer Mother     ?lung  . Diabetes Maternal Grandmother   . Heart disease Neg Hx     Allergies  Allergen Reactions  . Atorvastatin Other (See Comments)    Muscle aches    Current Outpatient Prescriptions on File Prior to Visit  Medication Sig Dispense Refill  . amLODipine (NORVASC) 10 MG tablet TAKE 1 TABLET(10 MG) BY MOUTH DAILY 90 tablet 1  . aspirin EC 81 MG tablet Take 1 tablet (81 mg total) by mouth daily.    . Blood Glucose Monitoring Suppl (ACCU-CHEK AVIVA PLUS) W/DEVICE KIT Test blood sugar once daily. Dx Code: E11.9 1 kit 1  . carvedilol (COREG) 6.25 MG tablet Take 1 tablet (6.25 mg total) by mouth 2 (two) times daily with a meal. 30 tablet 0  . cetirizine (ZYRTEC) 10 MG tablet Take 10 mg by mouth daily as needed for allergies.    . cloNIDine (CATAPRES) 0.1 MG tablet TAKE 1 TABLET BY MOUTH THREE TIMES DAILY 90 tablet 5  .  glucose blood (ACCU-CHEK AVIVA PLUS) test strip Test blood sugar once daily. Dx Code: E11.9 100 each 23  . INVOKANA 100 MG TABS tablet TAKE 1 TABLET BY MOUTH BEFORE BREAKFAST 30 tablet 0  . lisinopril-hydrochlorothiazide (PRINZIDE,ZESTORETIC) 20-25 MG tablet TAKE 1 TABLET BY MOUTH DAILY 30 tablet 5  . metFORMIN (GLUCOPHAGE) 1000 MG tablet TAKE 1 TABLET(1000 MG) BY MOUTH TWICE DAILY WITH A MEAL 180 tablet 1  . ONETOUCH DELICA LANCETS FINE MISC Reported on 05/04/2015  11  . pravastatin (PRAVACHOL) 20 MG tablet TAKE 1 TABLET(20 MG) BY MOUTH DAILY 30 tablet 5  . sitaGLIPtin (JANUVIA) 100 MG tablet TAKE 1 TABLET(100 MG) BY MOUTH DAILY 30 tablet 2  . tadalafil (CIALIS) 10 MG tablet Take 1 tablet (10 mg total) by mouth daily as needed for erectile dysfunction. 10 tablet 5   No current facility-administered medications on file prior to visit.     BP (!) 170/63 (BP  Location: Right Arm, Cuff Size: Large)   Pulse 71   Temp 97.9 F (36.6 C) (Oral)   Resp 17   Ht 5' 9"  (1.753 m)   Wt 206 lb (93.4 kg)   SpO2 99%   BMI 30.42 kg/m    Objective:   Physical Exam  Constitutional: He is oriented to person, place, and time. He appears well-developed and well-nourished. No distress.  HENT:  Head: Normocephalic and atraumatic.  Cardiovascular: Normal rate and regular rhythm.   No murmur heard. Pulmonary/Chest: Effort normal and breath sounds normal. No respiratory distress. He has no wheezes. He has no rales.  Musculoskeletal: He exhibits no edema.  Neurological: He is alert and oriented to person, place, and time.  Skin: Skin is warm and dry.  Psychiatric: He has a normal mood and affect. His behavior is normal. Thought content normal.          Assessment & Plan:

## 2016-05-03 NOTE — Assessment & Plan Note (Signed)
>>  ASSESSMENT AND PLAN FOR CONGESTIVE DILATED CARDIOMYOPATHY (HCC) WRITTEN ON 05/03/2016  9:08 AM BY O'SULLIVAN, Illyana Schorsch, NP  Will order follow up 2D echo. Advised pt to arrange follow up with Dr. Mayford Knife.

## 2016-05-03 NOTE — Assessment & Plan Note (Signed)
Will order follow up 2D echo. Advised pt to arrange follow up with Dr. Mayford Knife.

## 2016-05-03 NOTE — Assessment & Plan Note (Signed)
Uncontrolled. Discussed healthy diet, exercise, weight loss.  Advised pt to arrange follow up with endocrinology. He declines flu shot.

## 2016-05-05 ENCOUNTER — Other Ambulatory Visit: Payer: Self-pay | Admitting: Family

## 2016-05-05 ENCOUNTER — Telehealth: Payer: Self-pay | Admitting: Family

## 2016-05-05 MED ORDER — PRAVASTATIN SODIUM 40 MG PO TABS: 40.0000 mg | ORAL_TABLET | Freq: Every day | ORAL | 3 refills | Status: DC

## 2016-05-05 NOTE — Progress Notes (Signed)
Opened in error

## 2016-05-06 NOTE — Telephone Encounter (Signed)
Opened in error

## 2016-05-12 ENCOUNTER — Other Ambulatory Visit: Payer: Self-pay | Admitting: Endocrinology

## 2016-05-15 ENCOUNTER — Other Ambulatory Visit (HOSPITAL_BASED_OUTPATIENT_CLINIC_OR_DEPARTMENT_OTHER): Payer: BLUE CROSS/BLUE SHIELD

## 2016-05-17 ENCOUNTER — Ambulatory Visit (INDEPENDENT_AMBULATORY_CARE_PROVIDER_SITE_OTHER): Payer: BLUE CROSS/BLUE SHIELD | Admitting: Family

## 2016-05-17 DIAGNOSIS — I10 Essential (primary) hypertension: Secondary | ICD-10-CM

## 2016-05-17 NOTE — Progress Notes (Signed)
Noted and agree. 

## 2016-05-17 NOTE — Progress Notes (Signed)
Pre visit review using our clinic tool,if applicable. No additional management support is needed unless otherwise documented below in the visit note.   Patient in for BP check per order from Evonnie Dawes NP . BP=125/78 P=62  Patient advised to continue taking medications as ordered and to monitor salt intake. Return to office for visit with provider in 3 months. Patient agreed.

## 2016-05-20 ENCOUNTER — Other Ambulatory Visit: Payer: Self-pay | Admitting: Family

## 2016-05-31 ENCOUNTER — Ambulatory Visit (HOSPITAL_COMMUNITY): Admission: RE | Admit: 2016-05-31 | Payer: BLUE CROSS/BLUE SHIELD | Source: Ambulatory Visit

## 2016-06-10 ENCOUNTER — Other Ambulatory Visit: Payer: Self-pay | Admitting: Endocrinology

## 2016-06-10 NOTE — Telephone Encounter (Signed)
Last office visit 10/20/15 no future scheduled- ok to refill?

## 2016-08-09 ENCOUNTER — Ambulatory Visit: Payer: BLUE CROSS/BLUE SHIELD | Admitting: Family

## 2016-08-09 ENCOUNTER — Telehealth: Payer: Self-pay | Admitting: Family

## 2016-08-09 DIAGNOSIS — Z0289 Encounter for other administrative examinations: Secondary | ICD-10-CM

## 2016-08-09 NOTE — Telephone Encounter (Signed)
Yes please

## 2016-08-09 NOTE — Telephone Encounter (Signed)
Patient lvm cancelling 3pm appointment today patient scheduled for follow up for 08/16/16, charge or no charge

## 2016-08-13 ENCOUNTER — Other Ambulatory Visit: Payer: Self-pay | Admitting: Family

## 2016-08-14 ENCOUNTER — Ambulatory Visit (INDEPENDENT_AMBULATORY_CARE_PROVIDER_SITE_OTHER): Payer: BLUE CROSS/BLUE SHIELD | Admitting: Family

## 2016-08-14 ENCOUNTER — Encounter: Payer: Self-pay | Admitting: Family

## 2016-08-14 VITALS — BP 130/75 | HR 78 | Temp 98.4°F | Resp 16 | Ht 69.0 in | Wt 210.0 lb

## 2016-08-14 DIAGNOSIS — IMO0001 Reserved for inherently not codable concepts without codable children: Secondary | ICD-10-CM

## 2016-08-14 DIAGNOSIS — M899 Disorder of bone, unspecified: Secondary | ICD-10-CM | POA: Diagnosis not present

## 2016-08-14 DIAGNOSIS — E1165 Type 2 diabetes mellitus with hyperglycemia: Secondary | ICD-10-CM

## 2016-08-14 LAB — BASIC METABOLIC PANEL
BUN: 24 mg/dL — ABNORMAL HIGH (ref 6–23)
CALCIUM: 9.5 mg/dL (ref 8.4–10.5)
CHLORIDE: 98 meq/L (ref 96–112)
CO2: 25 meq/L (ref 19–32)
CREATININE: 0.98 mg/dL (ref 0.40–1.50)
GFR: 82.83 mL/min (ref >60.00)
GLUCOSE: 230 mg/dL — AB (ref 70–99)
Potassium: 4 mEq/L (ref 3.5–5.1)
Sodium: 135 mEq/L (ref 135–145)

## 2016-08-14 LAB — HEMOGLOBIN A1C: Hgb A1c MFr Bld: 7.8 % — ABNORMAL HIGH (ref 4.6–6.5)

## 2016-08-14 MED ORDER — CANAGLIFLOZIN 100 MG PO TABS: ORAL_TABLET | ORAL | 5 refills | Status: DC

## 2016-08-14 NOTE — Progress Notes (Signed)
Subjective:    Patient ID: Paul Yu, male    DOB: 10-Feb-1956, 61 y.o.   MRN: [id number]  HPI  Paul Yu is a 61 yr old male who presents today following a fall at work.  He stumbled fell out of the back of a truck. Occurred 1 week ago.  Has some back/shoulder soreness.  He is being followed by an urgent care for workman's comp related to this injury. He comes to see me today because incidental note was made of a skull lucency in the left parietal bone during his work up at urgent care. Denies current headache.   DM2- Ran out of invokana about 3 weeks ago.   Lab Results  Component Value Date   HGBA1C 8.0 (H) 05/03/2016     Review of Systems See HPI  Past Medical History:  Diagnosis Date  . Diabetes mellitus without complication [facility])      Social History   Social History  . Marital status: Married    Spouse name: N/A  . Number of children: N/A  . Years of education: N/A   Occupational History  . Not on file.   Social History Main Topics  . Smoking status: Never Smoker  . Smokeless tobacco: Never Used  . Alcohol use 0.0 oz/week     Comment: rarely,   . Drug use: No  . Sexual activity: Not on file   Other Topics Concern  . Not on file   Social History Narrative   4th marriage-    Daughter born 2002   Son 1982-lives in [place]- in [place] a truck   Completed 9th grade- GED   Enjoys working on cars,         Past Surgical History:  Procedure Laterality Date  . LEFT HEART CATHETERIZATION WITH CORONARY ANGIOGRAM N/A 07/15/2014   Procedure: LEFT HEART CATHETERIZATION WITH CORONARY ANGIOGRAM;  Surgeon: Sherren Mocha, MD;  Location: [facility] CATH LAB;  Service: Cardiovascular;  Laterality: N/A;  . TONSILLECTOMY  1966    Family History  Problem Relation Age of Onset  . Cancer Mother        ?lung  . Diabetes Maternal Grandmother   . Heart disease Neg Hx     Allergies  Allergen Reactions  . Atorvastatin Other (See Comments)    Muscle  aches    Current Outpatient Prescriptions on File Prior to Visit  Medication Sig Dispense Refill  . amLODipine (NORVASC) 10 MG tablet TAKE 1 TABLET(10 MG) BY MOUTH DAILY 90 tablet 1  . aspirin EC 81 MG tablet Take 1 tablet (81 mg total) by mouth daily.    . Blood Glucose Monitoring Suppl (ACCU-CHEK AVIVA PLUS) W/DEVICE KIT Test blood sugar once daily. Dx Code: E11.9 1 kit 1  . carvedilol (COREG) 6.25 MG tablet Take 1 tablet (6.25 mg total) by mouth 2 (two) times daily with a meal. 60 tablet 3  . cetirizine (ZYRTEC) 10 MG tablet Take 10 mg by mouth daily as needed for allergies.    . cloNIDine (CATAPRES) 0.1 MG tablet TAKE 1 TABLET BY MOUTH THREE TIMES DAILY 90 tablet 5  . glucose blood (ACCU-CHEK AVIVA PLUS) test strip Test blood sugar once daily. Dx Code: E11.9 100 each 23  . lisinopril-hydrochlorothiazide (PRINZIDE,ZESTORETIC) 20-25 MG tablet TAKE 1 TABLET BY MOUTH DAILY 30 tablet 5  . metFORMIN (GLUCOPHAGE) 1000 MG tablet TAKE 1 TABLET BY MOUTH TWICE DAILY WITH A MEAL 180 tablet 1  . ONETOUCH DELICA LANCETS  FINE MISC Reported on 05/04/2015  11  . pravastatin (PRAVACHOL) 40 MG tablet Take 1 tablet (40 mg total) by mouth daily. 30 tablet 3  . sitaGLIPtin (JANUVIA) 100 MG tablet TAKE 1 TABLET(100 MG) BY MOUTH DAILY 30 tablet 2  . tadalafil (CIALIS) 10 MG tablet Take 1 tablet (10 mg total) by mouth daily as needed for erectile dysfunction. 10 tablet 5  . INVOKANA 100 MG TABS tablet TAKE 1 TABLET BY MOUTH BEFORE BREAKFAST (Patient not taking: Reported on 08/14/2016) 30 tablet 0   No current facility-administered medications on file prior to visit.     BP 130/75 (BP Location: Right Arm, Cuff Size: Normal)   Pulse 78   Temp 98.4 F (36.9 C) (Oral)   Resp 16   Ht 5' 9" (1.753 m)   Wt 210 lb (95.3 kg)   SpO2 100% Comment: room air  BMI 31.01 kg/m       Objective:   Physical Exam  Constitutional: He is oriented to person, place, and time. He appears well-developed and well-nourished. No  distress.  HENT:  Head: Normocephalic and atraumatic.  Eyes: Pupils are equal, round, and reactive to light.  Cardiovascular: Normal rate and regular rhythm.   No murmur heard. Pulmonary/Chest: Effort normal and breath sounds normal. No respiratory distress. He has no wheezes. He has no rales.  Musculoskeletal: He exhibits no edema.  Neurological: He is alert and oriented to person, place, and time. He exhibits normal muscle tone.  Skin: Skin is warm and dry.  Psychiatric: He has a normal mood and affect. His behavior is normal. Thought content normal.          Assessment & Plan:  Skull lesion- reviewed report (skull x ray) and also reviewed the skull images.  Lesion is clearly noted in the left parietal bone. New. I did speak with Dr. Sandi Mariscal, radiologist about the findings and he recommended non-contrast CT to further evaluate. Discussed with pt and he is agreeable.   DM2- uncontrolled. Advised pt to restart invokana. Will check follow up A1C/bmet.

## 2016-08-14 NOTE — Patient Instructions (Signed)
Restart invokana. Please complete lab work prior to leaving. You will be contact about scheduling your head CT to further evaluate the spot on your skull.

## 2016-08-15 ENCOUNTER — Ambulatory Visit (HOSPITAL_BASED_OUTPATIENT_CLINIC_OR_DEPARTMENT_OTHER)
Admission: RE | Admit: 2016-08-15 | Discharge: 2016-08-15 | Disposition: A | Discharge status: Home / Self Care | Payer: BLUE CROSS/BLUE SHIELD | Source: Ambulatory Visit | Attending: Family | Admitting: Family

## 2016-08-15 ENCOUNTER — Encounter: Payer: Self-pay | Admitting: Family

## 2016-08-15 DIAGNOSIS — R9082 White matter disease, unspecified: Secondary | ICD-10-CM | POA: Insufficient documentation

## 2016-08-15 DIAGNOSIS — S0990XA Unspecified injury of head, initial encounter: Secondary | ICD-10-CM | POA: Diagnosis not present

## 2016-08-15 DIAGNOSIS — W19XXXA Unspecified fall, initial encounter: Secondary | ICD-10-CM | POA: Insufficient documentation

## 2016-08-15 DIAGNOSIS — M899 Disorder of bone, unspecified: Secondary | ICD-10-CM | POA: Diagnosis not present

## 2016-08-16 ENCOUNTER — Ambulatory Visit: Payer: BLUE CROSS/BLUE SHIELD | Admitting: Family

## 2016-08-18 ENCOUNTER — Other Ambulatory Visit: Payer: Self-pay | Admitting: Family

## 2016-10-21 ENCOUNTER — Other Ambulatory Visit: Payer: Self-pay | Admitting: Family

## 2016-10-28 ENCOUNTER — Other Ambulatory Visit: Payer: Self-pay | Admitting: Family

## 2016-11-15 ENCOUNTER — Ambulatory Visit: Payer: BLUE CROSS/BLUE SHIELD | Admitting: Family

## 2016-12-27 ENCOUNTER — Other Ambulatory Visit: Payer: Self-pay | Admitting: Family

## 2017-03-09 ENCOUNTER — Other Ambulatory Visit: Payer: Self-pay | Admitting: Family

## 2017-03-22 ENCOUNTER — Telehealth: Payer: Self-pay | Admitting: Family

## 2017-03-24 NOTE — Telephone Encounter (Signed)
Called pt and LVM to advise him that he is past due for a follow up. Also advised that a partial Rx was sent in but no further could be filled without being seen.

## 2017-03-24 NOTE — Telephone Encounter (Signed)
Pt past due for follow up of BP and glucose. 10 day supply of amlodipine sent to pharmacy. Please call pt to schedule appt with Melissa soon as additional refills cannot be given until pt is seen in the office. I already sent him a mychart message in October that he has not read.Thanks!

## 2017-03-26 NOTE — Telephone Encounter (Signed)
Please attempt to reach pt again regarding need for appointment as he still has not called back to schedule this. Thanks!

## 2017-03-28 NOTE — Telephone Encounter (Signed)
Called pt and left another voicemail advising that he needs a follow up visit before any further refills could be sent.

## 2017-05-05 ENCOUNTER — Other Ambulatory Visit: Payer: Self-pay | Admitting: Family

## 2017-05-05 NOTE — Telephone Encounter (Signed)
Refill request for lisinopril-hctz denied until patient was seen in the office. Please let us know when the patient schedules an appointment.

## 2017-05-05 NOTE — Telephone Encounter (Signed)
Patient has failed to respond to phone and mychart messages. I have mailed patient a letter stating to schedule an appointment.

## 2017-06-06 ENCOUNTER — Encounter: Payer: Self-pay | Admitting: Family

## 2017-06-06 ENCOUNTER — Ambulatory Visit: Payer: BLUE CROSS/BLUE SHIELD | Admitting: Family

## 2017-06-06 VITALS — BP 170/102 | HR 83 | Temp 98.4°F | Resp 16 | Ht 69.0 in | Wt 210.6 lb

## 2017-06-06 DIAGNOSIS — E1165 Type 2 diabetes mellitus with hyperglycemia: Secondary | ICD-10-CM

## 2017-06-06 DIAGNOSIS — Z23 Encounter for immunization: Secondary | ICD-10-CM

## 2017-06-06 DIAGNOSIS — IMO0002 Reserved for concepts with insufficient information to code with codable children: Secondary | ICD-10-CM

## 2017-06-06 DIAGNOSIS — E785 Hyperlipidemia, unspecified: Secondary | ICD-10-CM

## 2017-06-06 DIAGNOSIS — E1129 Type 2 diabetes mellitus with other diabetic kidney complication: Secondary | ICD-10-CM | POA: Diagnosis not present

## 2017-06-06 DIAGNOSIS — I10 Essential (primary) hypertension: Secondary | ICD-10-CM

## 2017-06-06 DIAGNOSIS — R809 Proteinuria, unspecified: Secondary | ICD-10-CM | POA: Diagnosis not present

## 2017-06-06 LAB — COMPREHENSIVE METABOLIC PANEL
ALK PHOS: 125 U/L — AB (ref 39–117)
ALT: 27 U/L (ref 0–53)
AST: 16 U/L (ref 0–37)
Albumin: 4.6 g/dL (ref 3.5–5.2)
BILIRUBIN TOTAL: 0.9 mg/dL (ref 0.2–1.2)
BUN: 15 mg/dL (ref 6–23)
CALCIUM: 10 mg/dL (ref 8.4–10.5)
CO2: 32 mEq/L (ref 19–32)
Chloride: 93 mEq/L — ABNORMAL LOW (ref 96–112)
Creatinine, Ser: 0.99 mg/dL (ref 0.40–1.50)
GFR: 81.65 mL/min (ref >60.00)
Glucose, Bld: 359 mg/dL — ABNORMAL HIGH (ref 70–99)
POTASSIUM: 3.7 meq/L (ref 3.5–5.1)
Sodium: 134 mEq/L — ABNORMAL LOW (ref 135–145)
TOTAL PROTEIN: 7.3 g/dL (ref 6.0–8.3)

## 2017-06-06 LAB — HEMOGLOBIN A1C: Hgb A1c MFr Bld: 9.9 % — ABNORMAL HIGH (ref 4.6–6.5)

## 2017-06-06 MED ORDER — CLONIDINE HCL 0.1 MG PO TABS: 0.1000 mg | ORAL_TABLET | Freq: Three times a day (TID) | ORAL | 5 refills | Status: DC

## 2017-06-06 MED ORDER — LISINOPRIL-HYDROCHLOROTHIAZIDE 20-25 MG PO TABS: 1.0000 | ORAL_TABLET | Freq: Every day | ORAL | 5 refills | Status: DC

## 2017-06-06 MED ORDER — SITAGLIPTIN PHOSPHATE 100 MG PO TABS: ORAL_TABLET | ORAL | 5 refills | Status: DC

## 2017-06-06 MED ORDER — AMLODIPINE BESYLATE 10 MG PO TABS: ORAL_TABLET | ORAL | 5 refills | Status: DC

## 2017-06-06 MED ORDER — CANAGLIFLOZIN 100 MG PO TABS: ORAL_TABLET | ORAL | 5 refills | Status: DC

## 2017-06-06 MED ORDER — PRAVASTATIN SODIUM 40 MG PO TABS: 40.0000 mg | ORAL_TABLET | Freq: Every day | ORAL | 3 refills | Status: DC

## 2017-06-06 MED ORDER — CARVEDILOL 6.25 MG PO TABS: ORAL_TABLET | ORAL | 5 refills | Status: DC

## 2017-06-06 MED ORDER — METFORMIN HCL 1000 MG PO TABS
ORAL_TABLET | ORAL | 5 refills | Status: DC
Start: 2017-06-06 — End: 2018-02-11

## 2017-06-06 NOTE — Patient Instructions (Signed)
Please restart all your medications. Work on healthy diabetic diet, exercise and weight loss.

## 2017-06-06 NOTE — Addendum Note (Signed)
Addended by: Mervin Kung A on: 06/06/2017 01:29 PM   Modules accepted: Orders

## 2017-06-06 NOTE — Progress Notes (Signed)
Subjective:    Patient ID: Paul Yu, male    DOB: Feb 21, 1956, 62 y.o.   MRN: [id number]  HPI  DM2-  Not checking sugars at home.  Out of his meds for about 1 month.   Lab Results  Component Value Date   HGBA1C 7.8 (H) 08/14/2016   HGBA1C 8.0 (H) 05/03/2016   HGBA1C 8.2 (H) 08/11/2015   Lab Results  Component Value Date   MICROALBUR 32.2 (H) 08/11/2015   LDLCALC 108 (H) 05/03/2016   CREATININE 0.98 08/14/2016   HTN-  BP Readings from Last 3 Encounters:  06/06/17 (!) 170/102  08/14/16 130/75  05/03/16 (!) 170/63   Hyperlipidemia-   Lab Results  Component Value Date   CHOL 177 05/03/2016   HDL 43.60 05/03/2016   LDLCALC 108 (H) 05/03/2016   LDLDIRECT 131.0 12/23/2014   TRIG 129.0 05/03/2016   CHOLHDL 4 05/03/2016      Review of Systems See HPI  Past Medical History:  Diagnosis Date  . Diabetes mellitus without complication [facility])      Social History   Socioeconomic History  . Marital status: Married    Spouse name: Not on file  . Number of children: Not on file  . Years of education: Not on file  . Highest education level: Not on file  Social Needs  . Financial resource strain: Not on file  . Food insecurity - worry: Not on file  . Food insecurity - inability: Not on file  . Transportation needs - medical: Not on file  . Transportation needs - non-medical: Not on file  Occupational History  . Not on file  Tobacco Use  . Smoking status: Never Smoker  . Smokeless tobacco: Never Used  Substance and Sexual Activity  . Alcohol use: Yes    Alcohol/week: 0.0 oz    Comment: rarely,   . Drug use: No  . Sexual activity: Not on file  Other Topics Concern  . Not on file  Social History Narrative   4th marriage-    Daughter born 2002   Son 1982-lives in [place]- in [place] a truck   Completed 9th grade- GED   Enjoys working on cars,      Past Surgical History:  Procedure Laterality Date  . LEFT HEART CATHETERIZATION  WITH CORONARY ANGIOGRAM N/A 07/15/2014   Procedure: LEFT HEART CATHETERIZATION WITH CORONARY ANGIOGRAM;  Surgeon: Sherren Mocha, MD;  Location: [facility] CATH LAB;  Service: Cardiovascular;  Laterality: N/A;  . TONSILLECTOMY  1966    Family History  Problem Relation Age of Onset  . Cancer Mother        ?lung  . Diabetes Maternal Grandmother   . Heart disease Neg Hx     Allergies  Allergen Reactions  . Atorvastatin Other (See Comments)    Muscle aches    Current Outpatient Medications on File Prior to Visit  Medication Sig Dispense Refill  . aspirin EC 81 MG tablet Take 1 tablet (81 mg total) by mouth daily.    . Blood Glucose Monitoring Suppl (ACCU-CHEK AVIVA PLUS) W/DEVICE KIT Test blood sugar once daily. Dx Code: E11.9 1 kit 1  . cetirizine (ZYRTEC) 10 MG tablet Take 10 mg by mouth daily as needed for allergies.    Marland Kitchen glucose blood (ACCU-CHEK AVIVA PLUS) test strip Test blood sugar once daily. Dx Code: E11.9 100 each 23  . ONETOUCH DELICA LANCETS FINE MISC Reported on 05/04/2015  11  . tadalafil (CIALIS)  10 MG tablet Take 1 tablet (10 mg total) by mouth daily as needed for erectile dysfunction. 10 tablet 5   No current facility-administered medications on file prior to visit.     BP (!) 170/102 (BP Location: Right Arm, Cuff Size: Large)   Pulse 83   Temp 98.4 F (36.9 C) (Oral)   Resp 16   Ht 5' 9" (1.753 m)   Wt 210 lb 9.6 oz (95.5 kg)   SpO2 97%   BMI 31.10 kg/m       Objective:   Physical Exam  Constitutional: He is oriented to person, place, and time. He appears well-developed and well-nourished. No distress.  HENT:  Head: Normocephalic and atraumatic.  Cardiovascular: Normal rate and regular rhythm.  No murmur heard. Pulmonary/Chest: Effort normal and breath sounds normal. No respiratory distress. He has no wheezes. He has no rales.  Musculoskeletal: He exhibits no edema.  Neurological: He is alert and oriented to person, place, and time.  Skin: Skin is warm and  dry.  Psychiatric: He has a normal mood and affect. His behavior is normal. Thought content normal.          Assessment & Plan:  DM2- uncontrolled, did not follow up as instructed. Out of DM meds. Restart meds, counseled on risks of poorly controlled DM2.Pneumovax 23 today, declines flu shot.   HTN- uncontrolled- off of meds. Restart.  Hyperlipidemia- suspect lipids up since he has been off statin, restart.

## 2017-06-08 ENCOUNTER — Telehealth: Payer: Self-pay | Admitting: Family

## 2017-06-08 DIAGNOSIS — R748 Abnormal levels of other serum enzymes: Secondary | ICD-10-CM

## 2017-06-08 NOTE — Telephone Encounter (Signed)
Labs show sugar is uncontrolled. Restart diabetes meds.  Work on diet, exercise.   Alk phos is elevated.  This is sometimes from liver, gallbladder or bone.  I would like to repeat pended labs in 2 weeks please.

## 2017-06-09 NOTE — Telephone Encounter (Signed)
Left message for pt to return my call.Dictation #1 [id number]  [id number]

## 2017-06-16 NOTE — Telephone Encounter (Signed)
Left message for pt to return my call.

## 2017-06-18 NOTE — Telephone Encounter (Signed)
Mailed letter to pt. Signed pended labs.

## 2017-09-05 ENCOUNTER — Ambulatory Visit: Payer: BLUE CROSS/BLUE SHIELD | Admitting: Family

## 2017-10-24 ENCOUNTER — Encounter: Payer: Self-pay | Admitting: Family

## 2017-10-24 ENCOUNTER — Ambulatory Visit: Payer: BLUE CROSS/BLUE SHIELD | Admitting: Family

## 2017-10-24 ENCOUNTER — Telehealth: Payer: Self-pay | Admitting: Family

## 2017-10-24 VITALS — BP 116/71 | HR 71 | Temp 98.4°F | Resp 16 | Ht 69.0 in | Wt 215.0 lb

## 2017-10-24 DIAGNOSIS — E785 Hyperlipidemia, unspecified: Secondary | ICD-10-CM | POA: Diagnosis not present

## 2017-10-24 DIAGNOSIS — I10 Essential (primary) hypertension: Secondary | ICD-10-CM

## 2017-10-24 DIAGNOSIS — IMO0002 Reserved for concepts with insufficient information to code with codable children: Secondary | ICD-10-CM

## 2017-10-24 DIAGNOSIS — E1129 Type 2 diabetes mellitus with other diabetic kidney complication: Secondary | ICD-10-CM | POA: Diagnosis not present

## 2017-10-24 DIAGNOSIS — E1165 Type 2 diabetes mellitus with hyperglycemia: Secondary | ICD-10-CM | POA: Diagnosis not present

## 2017-10-24 DIAGNOSIS — R809 Proteinuria, unspecified: Secondary | ICD-10-CM

## 2017-10-24 LAB — BASIC METABOLIC PANEL
BUN: 18 mg/dL (ref 6–23)
CHLORIDE: 99 meq/L (ref 96–112)
CO2: 28 mEq/L (ref 19–32)
Calcium: 9.6 mg/dL (ref 8.4–10.5)
Creatinine, Ser: 1.05 mg/dL (ref 0.40–1.50)
GFR: 76.19 mL/min (ref >60.00)
Glucose, Bld: 202 mg/dL — ABNORMAL HIGH (ref 70–99)
POTASSIUM: 4.3 meq/L (ref 3.5–5.1)
SODIUM: 137 meq/L (ref 135–145)

## 2017-10-24 LAB — LIPID PANEL
CHOL/HDL RATIO: 4
Cholesterol: 168 mg/dL (ref 0–200)
HDL: 45.1 mg/dL (ref >39.00)
LDL CALC: 84 mg/dL (ref 0–99)
NONHDL: 122.68
Triglycerides: 191 mg/dL — ABNORMAL HIGH (ref 0.0–149.0)
VLDL: 38.2 mg/dL (ref 0.0–40.0)

## 2017-10-24 LAB — HEMOGLOBIN A1C: HEMOGLOBIN A1C: 8.6 % — AB (ref 4.6–6.5)

## 2017-10-24 MED ORDER — ONETOUCH VERIO W/DEVICE KIT: PACK | 0 refills | Status: AC

## 2017-10-24 MED ORDER — GLUCOSE BLOOD VI STRP: ORAL_STRIP | 5 refills | Status: DC

## 2017-10-24 MED ORDER — BASAGLAR KWIKPEN 100 UNIT/ML ~~LOC~~ SOPN: 10.0000 [IU] | PEN_INJECTOR | Freq: Every day | SUBCUTANEOUS | 5 refills | Status: DC

## 2017-10-24 NOTE — Telephone Encounter (Signed)
Sugar remains above goal.  I would recommend that he start insulin once daily.  I have sent  rx. Let's bring him in for insulin teaching and he can bring his new glucose meter I sent to his pharmacy as well for help using that. Also, can you check which needle tips go with basaglar and send those as well please?

## 2017-10-24 NOTE — Patient Instructions (Addendum)
Please complete lab work prior to leaving.   

## 2017-10-24 NOTE — Progress Notes (Signed)
Subjective:    Patient ID: Paul Yu, male    DOB: Aug 01, 1955, 62 y.o.   MRN: [id number]  HPI  Patient is a 62 year old male who presents today for routine follow-up.  DM2-Invokana, metformin, and Januvia. Trying to eat more salads. Had been out of medication at time of last A1C but reports that he is back on everything. He is not checking his sugars.   Lab Results  Component Value Date   HGBA1C 9.9 (H) 06/06/2017   HGBA1C 7.8 (H) 08/14/2016   HGBA1C 8.0 (H) 05/03/2016   Lab Results  Component Value Date   MICROALBUR 32.2 (H) 08/11/2015   LDLCALC 108 (H) 05/03/2016   CREATININE 0.99 06/06/2017   HTN-current blood pressure medication includes carvedilol, clonidine, lisinopril hydrochlorothiazide, and amlodipine. BP Readings from Last 3 Encounters:  10/24/17 116/71  06/06/17 (!) 170/102  08/14/16 130/75   Hyperlipidemia-he is maintained on pravastatin. Reports good compliance.   Lab Results  Component Value Date   CHOL 177 05/03/2016   HDL 43.60 05/03/2016   LDLCALC 108 (H) 05/03/2016   LDLDIRECT 131.0 12/23/2014   TRIG 129.0 05/03/2016   CHOLHDL 4 05/03/2016    Review of Systems See HPI  Past Medical History:  Diagnosis Date  . Diabetes mellitus without complication [facility])      Social History   Socioeconomic History  . Marital status: Married    Spouse name: Not on file  . Number of children: Not on file  . Years of education: Not on file  . Highest education level: Not on file  Occupational History  . Not on file  Social Needs  . Financial resource strain: Not on file  . Food insecurity:    Worry: Not on file    Inability: Not on file  . Transportation needs:    Medical: Not on file    Non-medical: Not on file  Tobacco Use  . Smoking status: Never Smoker  . Smokeless tobacco: Never Used  Substance and Sexual Activity  . Alcohol use: Yes    Alcohol/week: 0.0 oz    Comment: rarely,   . Drug use: No  . Sexual activity: Not on file  Lifestyle    . Physical activity:    Days per week: Not on file    Minutes per session: Not on file  . Stress: Not on file  Relationships  . Social connections:    Talks on phone: Not on file    Gets together: Not on file    Attends religious service: Not on file    Active member of club or organization: Not on file    Attends meetings of clubs or organizations: Not on file    Relationship status: Not on file  . Intimate partner violence:    Fear of current or ex partner: Not on file    Emotionally abused: Not on file    Physically abused: Not on file    Forced sexual activity: Not on file  Other Topics Concern  . Not on file  Social History Narrative   4th marriage-    Daughter born 2002   Son 1982-lives in [place]- in [place] a truck   Completed 9th grade- GED   Enjoys working on cars,      Past Surgical History:  Procedure Laterality Date  . LEFT HEART CATHETERIZATION WITH CORONARY ANGIOGRAM N/A 07/15/2014   Procedure: LEFT HEART CATHETERIZATION WITH CORONARY ANGIOGRAM;  Surgeon: Sherren Mocha, MD;  Location:  [facility] CATH LAB;  Service: Cardiovascular;  Laterality: N/A;  . TONSILLECTOMY  1966    Family History  Problem Relation Age of Onset  . Cancer Mother        ?lung  . Diabetes Maternal Grandmother   . Heart disease Neg Hx     Allergies  Allergen Reactions  . Atorvastatin Other (See Comments)    Muscle aches    Current Outpatient Medications on File Prior to Visit  Medication Sig Dispense Refill  . amLODipine (NORVASC) 10 MG tablet TAKE 1 TABLET(10 MG) BY MOUTH DAILY 30 tablet 5  . aspirin EC 81 MG tablet Take 1 tablet (81 mg total) by mouth daily.    . Blood Glucose Monitoring Suppl (ACCU-CHEK AVIVA PLUS) W/DEVICE KIT Test blood sugar once daily. Dx Code: E11.9 1 kit 1  . canagliflozin (INVOKANA) 100 MG TABS tablet TAKE 1 TABLET BY MOUTH BEFORE BREAKFAST 30 tablet 5  . carvedilol (COREG) 6.25 MG tablet TAKE 1 TABLET BY MOUTH TWICE DAILY WITH A  MEAL 60 tablet 5  . cetirizine (ZYRTEC) 10 MG tablet Take 10 mg by mouth daily as needed for allergies.    . cloNIDine (CATAPRES) 0.1 MG tablet Take 1 tablet (0.1 mg total) by mouth 3 (three) times daily. 90 tablet 5  . glucose blood (ACCU-CHEK AVIVA PLUS) test strip Test blood sugar once daily. Dx Code: E11.9 100 each 23  . lisinopril-hydrochlorothiazide (PRINZIDE,ZESTORETIC) 20-25 MG tablet Take 1 tablet by mouth daily. 30 tablet 5  . metFORMIN (GLUCOPHAGE) 1000 MG tablet TAKE 1 TABLET BY MOUTH TWICE DAILY WITH A MEAL 60 tablet 5  . ONETOUCH DELICA LANCETS FINE MISC Reported on 05/04/2015  11  . pravastatin (PRAVACHOL) 40 MG tablet Take 1 tablet (40 mg total) by mouth daily. 30 tablet 3  . sitaGLIPtin (JANUVIA) 100 MG tablet TAKE 1 TABLET(100 MG) BY MOUTH DAILY 30 tablet 5  . tadalafil (CIALIS) 10 MG tablet Take 1 tablet (10 mg total) by mouth daily as needed for erectile dysfunction. 10 tablet 5   No current facility-administered medications on file prior to visit.     BP 116/71 (BP Location: Left Arm, Cuff Size: Normal)   Pulse 71   Temp 98.4 F (36.9 C) (Oral)   Resp 16   Ht 5' 9"  (1.753 m)   Wt 215 lb (97.5 kg)   SpO2 98%   BMI 31.75 kg/m       Objective:   Physical Exam  Constitutional: He is oriented to person, place, and time. He appears well-developed and well-nourished. No distress.  HENT:  Head: Normocephalic and atraumatic.  Cardiovascular: Normal rate and regular rhythm.  No murmur heard. Pulmonary/Chest: Effort normal and breath sounds normal. No respiratory distress. He has no wheezes. He has no rales.  Musculoskeletal: He exhibits no edema.  Neurological: He is alert and oriented to person, place, and time.  Skin: Skin is warm and dry.  Psychiatric: He has a normal mood and affect. His behavior is normal. Thought content normal.          Assessment & Plan:  HTN- BP is stable on current meds. Continue same.  Obtain follow up bmet.  Hyperlipidemia-  tolerating statin, obtain follow up lipid panel.  DM2- recheck A1C. Continue current meds.  Sent rx for new meter since his is malfunctioning.

## 2017-10-24 NOTE — Telephone Encounter (Signed)
Patient would like to complete cologuard please.  

## 2017-10-28 MED ORDER — INSULIN PEN NEEDLE 31G X 5 MM MISC: 3 refills | Status: DC

## 2017-10-28 NOTE — Telephone Encounter (Signed)
Left detailed message for pt to check with insurance regarding coverage first then let me know if it's covered and I will place the order.

## 2017-10-28 NOTE — Telephone Encounter (Signed)
Notified pt and scheduled nurse visit for tomorrow at 2:30pm. Prescription for pen needles sent to pharmacy.

## 2017-10-29 ENCOUNTER — Ambulatory Visit (INDEPENDENT_AMBULATORY_CARE_PROVIDER_SITE_OTHER): Payer: BLUE CROSS/BLUE SHIELD

## 2017-10-29 DIAGNOSIS — E1165 Type 2 diabetes mellitus with hyperglycemia: Secondary | ICD-10-CM

## 2017-10-29 DIAGNOSIS — E1129 Type 2 diabetes mellitus with other diabetic kidney complication: Secondary | ICD-10-CM | POA: Diagnosis not present

## 2017-10-29 DIAGNOSIS — IMO0002 Reserved for concepts with insufficient information to code with codable children: Secondary | ICD-10-CM

## 2017-10-29 DIAGNOSIS — R809 Proteinuria, unspecified: Secondary | ICD-10-CM | POA: Diagnosis not present

## 2017-10-29 NOTE — Progress Notes (Signed)
Pre visit review using our clinic tool,if applicable. No additional management support is needed unless otherwise documented below in the visit note.   Patient in for [facility] on how to use his Basaglar Kwik Pen acommpanired by daughter.. Patient shown how to use using tester then had patient give return demonstration using his pen. Patient voiced understanding and was able to use his Kwic Pen properly.   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 Return demonstration given successfully by patient. Advised patient if he had any questions in the future to please call the office.

## 2017-11-03 NOTE — Progress Notes (Signed)
I have reviewed the note by Marylouise Stacks LPN and agree with her documentation

## 2018-01-06 ENCOUNTER — Other Ambulatory Visit: Payer: Self-pay | Admitting: Family

## 2018-01-08 IMAGING — CT CT HEAD W/O CM
3 series · 14 of 47 positions shown, 16 images · non-contrast
Comparison: None.

CLINICAL DATA: Lucent lesion in left parietal skull. Fall off truck
1 week ago. No loss of consciousness.

EXAM:
CT HEAD WITHOUT CONTRAST
TECHNIQUE: Contiguous axial images were obtained from the base of the skull
through the vertex without intravenous contrast.

[Series 2: head wo · axial · 0.49mm/px · z∈[-169,-39]mm · 8 of 32 slices shown, 10 images]
[im 3/32  brain]
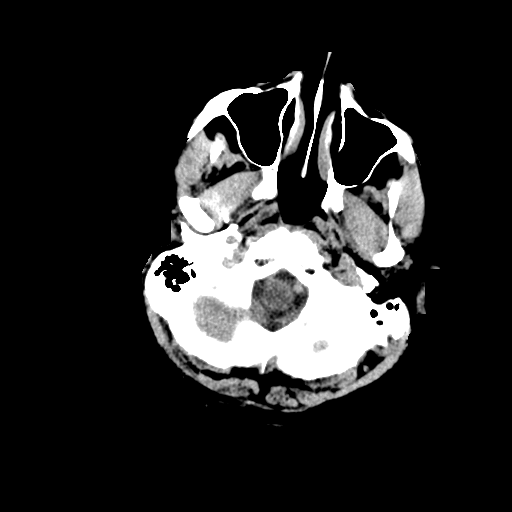
[im 3/32  bone]
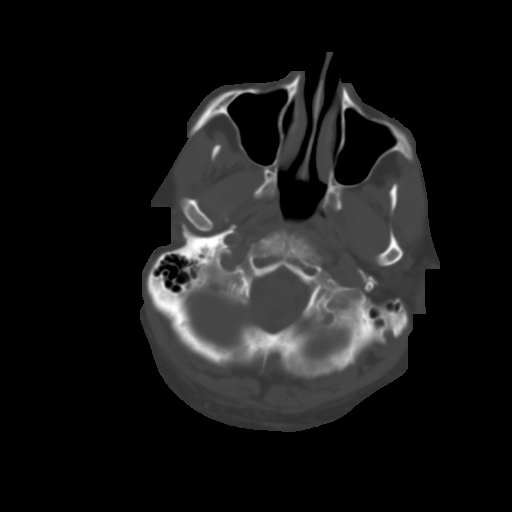
[im 7/32  brain]
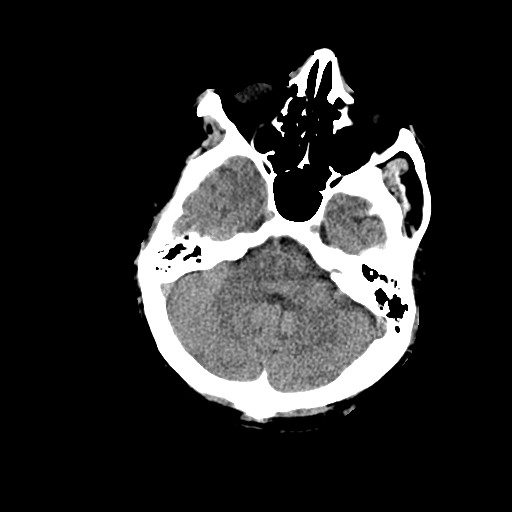
[im 10/32  brain]
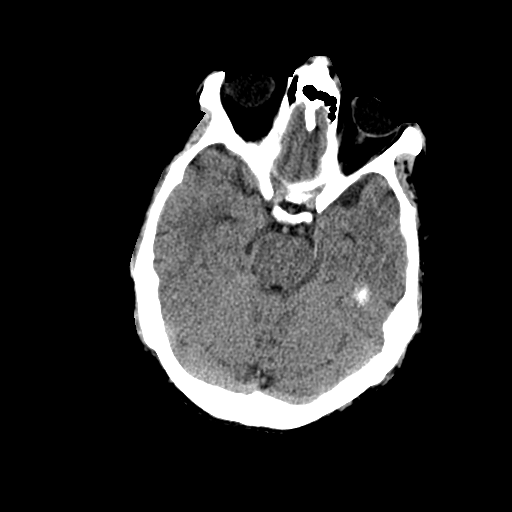
[im 14/32  brain]
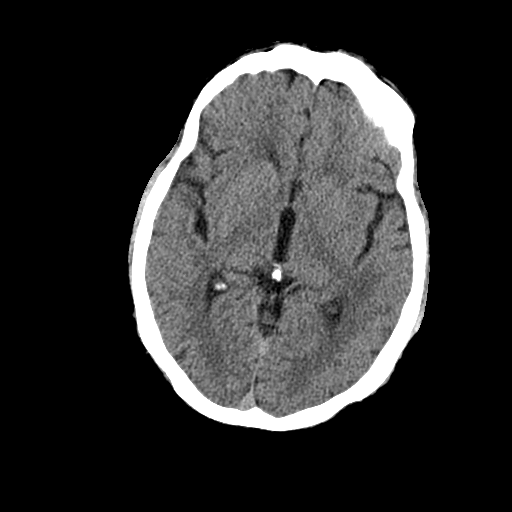
[im 18/32  brain]
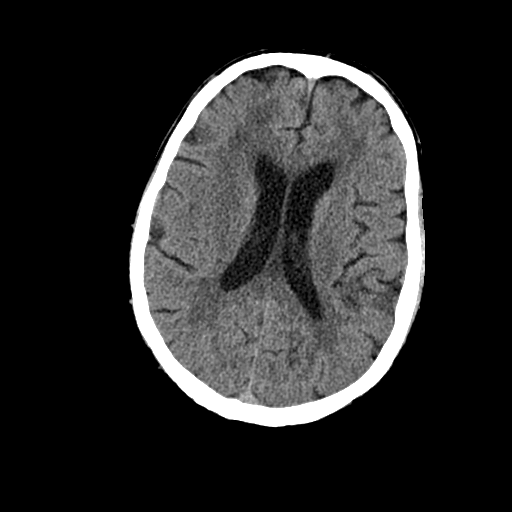
[im 18/32  bone]
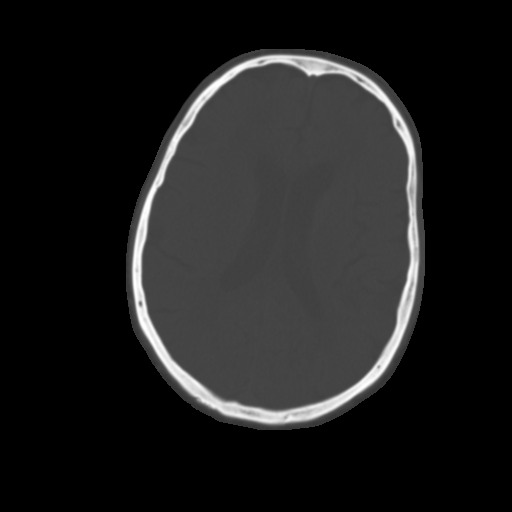
[im 22/32  brain]
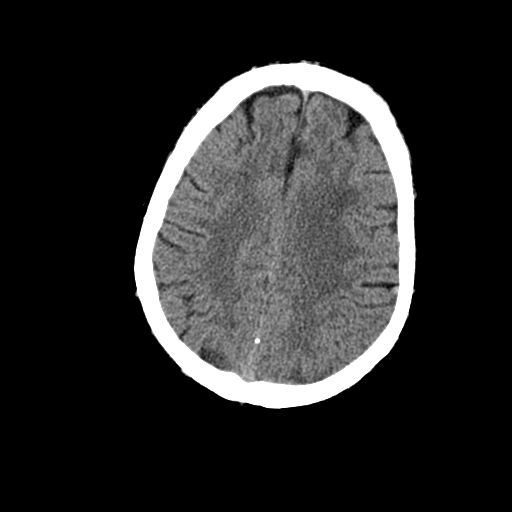
[im 25/32  brain]
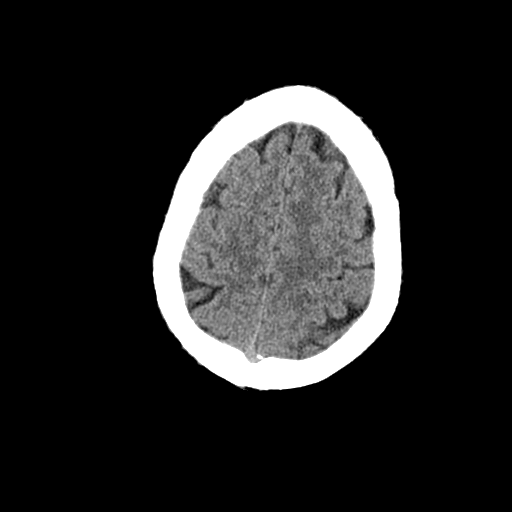
[im 29/32  brain]
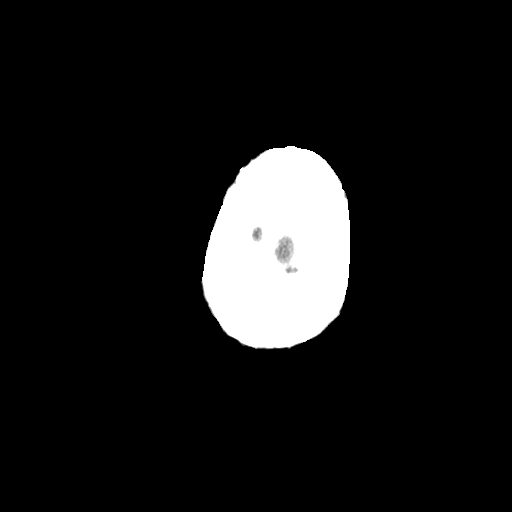

[Series 4: cor soft · coronal · 0.31mm/px · 3 of 70 slices shown]
[im 24/70  brain]
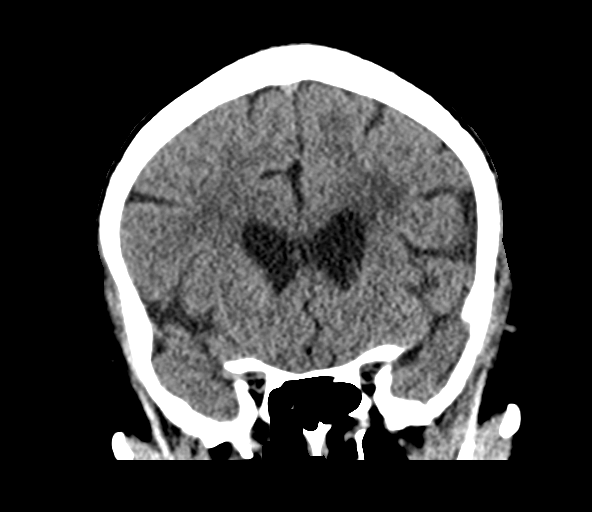
[im 31/70  brain]
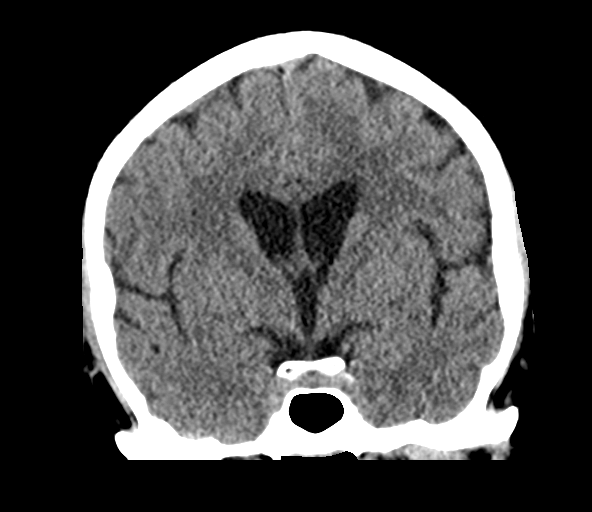
[im 39/70  brain]
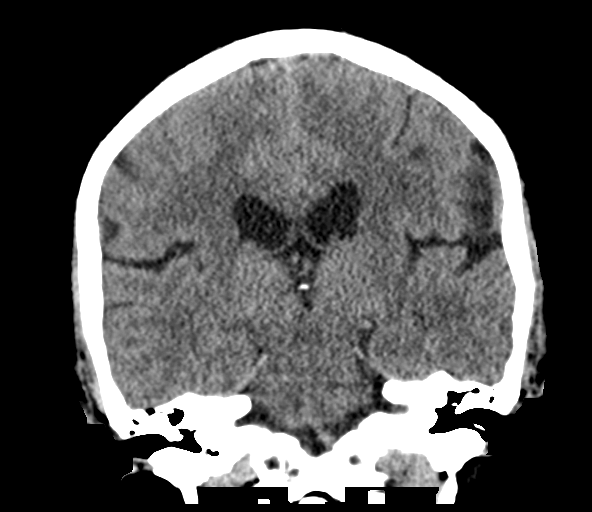

[Series 5: sag soft · sagittal · 0.32mm/px · 3 of 55 slices shown]
[im 19/55  brain]
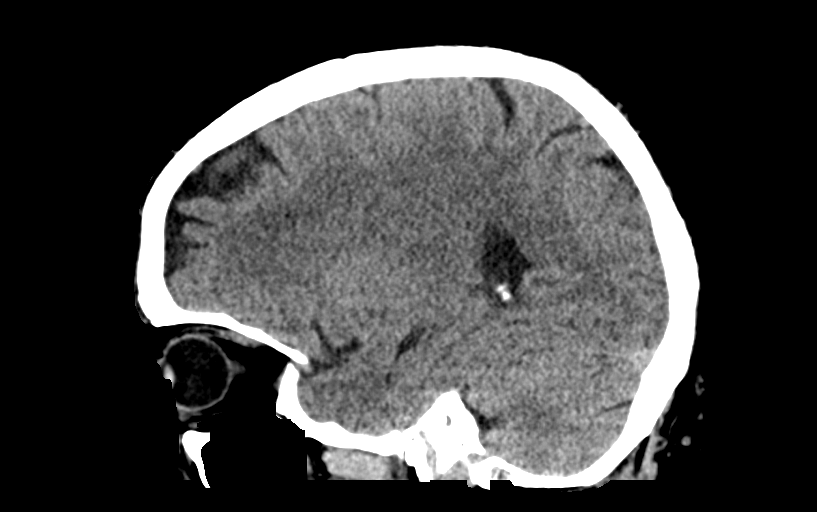
[im 28/55  brain]
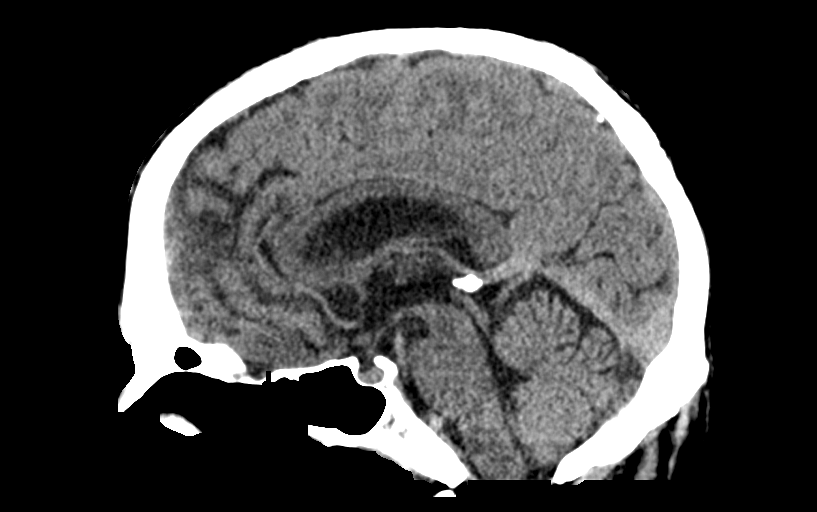
[im 37/55  brain]
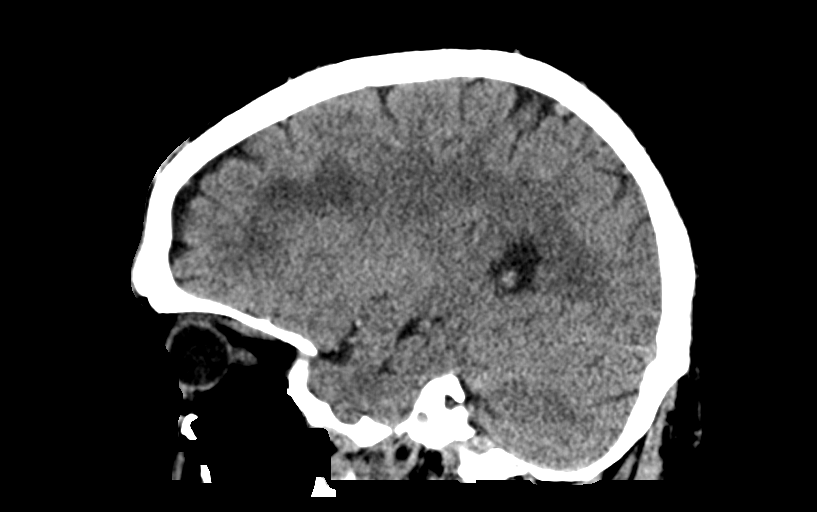

[14 of 47 positions shown; findings below may reference images not displayed]

FINDINGS: Brain: Mild chronic ischemic white matter disease is noted. No mass
effect or midline shift is noted. Ventricular size is within normal
limits. There is no evidence of mass lesion, hemorrhage or acute
infarction.

Vascular: No hyperdense vessel or unexpected calcification.

Skull: Oval-shaped well-defined lucency is seen in right posterior
parietal skull which involves the inner table in medullary portion
of the skull, but not the outer table. This most likely represents
benign venous lake in the absence of any history of malignancy.

Sinuses/Orbits: Mucous retention cyst is noted in right maxillary
sinus.

Other: None.
IMPRESSION: Mild chronic ischemic white matter disease. No acute intracranial
abnormality seen.

Oval-shaped well-defined lucency seen in right posterior parietal
skull which most likely represents benign finding in the absence of
any history of malignancy.

## 2018-01-16 ENCOUNTER — Other Ambulatory Visit: Payer: Self-pay | Admitting: Family

## 2018-01-16 NOTE — Telephone Encounter (Signed)
Januvia refill sent to pharmacy. Pt due for office visit on 01/24/18. Mychart message sent to pt to schedule appt.

## 2018-02-11 ENCOUNTER — Telehealth: Payer: Self-pay | Admitting: Family

## 2018-02-13 NOTE — Telephone Encounter (Signed)
30 day supply of refills sent to pharmacy. Pt has not read last mychart message that he is past due for 3 month follow up with Melissa. Please call pt to schedule appt soon. Thanks!

## 2018-02-16 NOTE — Telephone Encounter (Signed)
Called pt and LVM regarding his refill requests. Informed pt that a 30 day supply was sent, however, he is past due for a 3 month follow up and no further refills could be sent until he is seen in our office. Advised pt to call and schedule an appt at his earliest convenience.

## 2018-03-28 ENCOUNTER — Other Ambulatory Visit: Payer: Self-pay | Admitting: Family

## 2018-04-08 ENCOUNTER — Ambulatory Visit: Payer: BLUE CROSS/BLUE SHIELD | Admitting: Family

## 2018-04-08 ENCOUNTER — Telehealth: Payer: Self-pay | Admitting: Family

## 2018-04-08 ENCOUNTER — Encounter: Payer: Self-pay | Admitting: Family

## 2018-04-08 VITALS — BP 123/66 | HR 54 | Temp 97.6°F | Resp 16 | Ht 69.0 in | Wt 217.0 lb

## 2018-04-08 DIAGNOSIS — IMO0002 Reserved for concepts with insufficient information to code with codable children: Secondary | ICD-10-CM

## 2018-04-08 DIAGNOSIS — I10 Essential (primary) hypertension: Secondary | ICD-10-CM

## 2018-04-08 DIAGNOSIS — R809 Proteinuria, unspecified: Secondary | ICD-10-CM

## 2018-04-08 DIAGNOSIS — E1165 Type 2 diabetes mellitus with hyperglycemia: Secondary | ICD-10-CM

## 2018-04-08 DIAGNOSIS — E785 Hyperlipidemia, unspecified: Secondary | ICD-10-CM

## 2018-04-08 DIAGNOSIS — E1129 Type 2 diabetes mellitus with other diabetic kidney complication: Secondary | ICD-10-CM

## 2018-04-08 LAB — BASIC METABOLIC PANEL
BUN: 14 mg/dL (ref 6–23)
CO2: 28 mEq/L (ref 19–32)
Calcium: 9.1 mg/dL (ref 8.4–10.5)
Chloride: 102 mEq/L (ref 96–112)
Creatinine, Ser: 0.88 mg/dL (ref 0.40–1.50)
GFR: 93.28 mL/min (ref >60.00)
Glucose, Bld: 169 mg/dL — ABNORMAL HIGH (ref 70–99)
Potassium: 4.5 mEq/L (ref 3.5–5.1)
SODIUM: 136 meq/L (ref 135–145)

## 2018-04-08 LAB — HEMOGLOBIN A1C: Hgb A1c MFr Bld: 7.8 % — ABNORMAL HIGH (ref 4.6–6.5)

## 2018-04-08 MED ORDER — BASAGLAR KWIKPEN 100 UNIT/ML ~~LOC~~ SOPN: 15.0000 [IU] | PEN_INJECTOR | Freq: Every day | SUBCUTANEOUS | 5 refills | Status: DC

## 2018-04-08 NOTE — Progress Notes (Signed)
Subjective:    Patient ID: Paul Yu, male    DOB: [birth year], 63 y.o.   MRN: [id number]  HPI   Patient is a 63 yr old male who presents today for follow up  DM2-patient is maintained on Invokana, metformin and Januvia. Last visit we added basaglar 10 units once daily. Not checking sugar, denies symptomatic hypoglycemia. Diet is fair, not exercising.  Works 3 days a week 14-16 hours.  Reports loud snoring. "I stay tired." Declines home sleep study. Stopped [place] due to high copay.   Wt Readings from Last 3 Encounters:  04/08/18 217 lb (98.4 kg)  10/24/17 215 lb (97.5 kg)  06/06/17 210 lb 9.6 oz (95.5 kg)    Lab Results  Component Value Date   HGBA1C 8.6 (H) 10/24/2017   HGBA1C 9.9 (H) 06/06/2017   HGBA1C 7.8 (H) 08/14/2016   Lab Results  Component Value Date   MICROALBUR 32.2 (H) 08/11/2015   LDLCALC 84 10/24/2017   CREATININE 1.05 10/24/2017    Hypertension-current blood pressure medications include carvedilol, clonidine, lisinopril, hydrochlorothiazide, and amlodipine. Denies LE edema.  BP Readings from Last 3 Encounters:  04/08/18 123/66  10/24/17 116/71  06/06/17 (!) 170/102   Hyperlipidemia-patient is maintained on pravastatin.  Lab Results  Component Value Date   CHOL 168 10/24/2017   HDL 45.10 10/24/2017   LDLCALC 84 10/24/2017   LDLDIRECT 131.0 12/23/2014   TRIG 191.0 (H) 10/24/2017   CHOLHDL 4 10/24/2017       Review of Systems See HPI  Past Medical History:  Diagnosis Date  . Diabetes mellitus without complication [facility])      Social History   Socioeconomic History  . Marital status: Married    Spouse name: Not on file  . Number of children: Not on file  . Years of education: Not on file  . Highest education level: Not on file  Occupational History  . Not on file  Social Needs  . Financial resource strain: Not on file  . Food insecurity:    Worry: Not on file    Inability: Not on file  . Transportation needs:    Medical: Not on  file    Non-medical: Not on file  Tobacco Use  . Smoking status: Never Smoker  . Smokeless tobacco: Never Used  Substance and Sexual Activity  . Alcohol use: Yes    Alcohol/week: 0.0 standard drinks    Comment: rarely,   . Drug use: No  . Sexual activity: Not on file  Lifestyle  . Physical activity:    Days per week: Not on file    Minutes per session: Not on file  . Stress: Not on file  Relationships  . Social connections:    Talks on phone: Not on file    Gets together: Not on file    Attends religious service: Not on file    Active member of club or organization: Not on file    Attends meetings of clubs or organizations: Not on file    Relationship status: Not on file  . Intimate partner violence:    Fear of current or ex partner: Not on file    Emotionally abused: Not on file    Physically abused: Not on file    Forced sexual activity: Not on file  Other Topics Concern  . 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 LEFT HEART CATHETERIZATION WITH CORONARY ANGIOGRAM N/A 07/15/2014   Procedure: LEFT HEART CATHETERIZATION WITH CORONARY ANGIOGRAM;  Surgeon: Sherren Mocha, MD;  Location: [facility] CATH LAB;  Service: Cardiovascular;  Laterality: N/A;  . TONSILLECTOMY  1966    Family History  Problem Relation Age of Onset  . Cancer Mother        ?lung  . Diabetes Maternal Grandmother   . Heart disease Neg Hx     Allergies  Allergen Reactions  . Atorvastatin Other (See Comments)    Muscle aches    Current Outpatient Medications on File Prior to Visit  Medication Sig Dispense Refill  . amLODipine (NORVASC) 10 MG tablet TAKE 1 TABLET(10 MG) BY MOUTH DAILY 30 tablet 0  . aspirin EC 81 MG tablet Take 1 tablet (81 mg total) by mouth daily.    . Blood  Glucose Monitoring Suppl (ONETOUCH VERIO) w/Device KIT Test blood sugar once daily. Dx Code: E11.9 1 kit 0  . carvedilol (COREG) 6.25 MG tablet TAKE 1 TABLET BY MOUTH TWICE DAILY WITH A MEAL 60 tablet 0  . cetirizine (ZYRTEC) 10 MG tablet Take 10 mg by mouth daily as needed for allergies.    . cloNIDine (CATAPRES) 0.1 MG tablet TAKE 1 TABLET(0.1 MG) BY MOUTH THREE TIMES DAILY 90 tablet 0  . glucose blood (ONETOUCH VERIO) test strip Use as instructed 100 each 5  . Insulin Glargine (BASAGLAR KWIKPEN) 100 UNIT/ML SOPN Inject 0.1 mLs (10 Units total) into the skin daily. 3 mL 5  . Insulin Pen Needle 31G X 5 MM MISC Use with insulin injection once a day. 100 each 3  . INVOKANA 100 MG TABS tablet TAKE 1 TABLET BY MOUTH BEFORE BREAKFAST 30 tablet 0  . lisinopril-hydrochlorothiazide (PRINZIDE,ZESTORETIC) 20-25 MG tablet TAKE 1 TABLET BY MOUTH DAILY 30 tablet 0  . metFORMIN (GLUCOPHAGE) 1000 MG tablet TAKE 1 TABLET BY MOUTH TWICE DAILY WITH A MEAL 60 tablet 0  . ONETOUCH DELICA LANCETS FINE MISC Reported on 05/04/2015  11  . pravastatin (PRAVACHOL) 40 MG tablet Take 1 tablet (40 mg total) by mouth daily. 30 tablet 3  . sitaGLIPtin (JANUVIA) 100 MG tablet TAKE 1 TABLET(100 MG) BY MOUTH DAILY 30 tablet 0  . tadalafil (CIALIS) 10 MG tablet Take 1 tablet (10 mg total) by mouth daily as needed for erectile dysfunction. (Patient not taking: Reported on 04/08/2018) 10 tablet 5   No current facility-administered medications on file prior to visit.     BP 123/66 (BP Location: Right Arm, Patient Position: Sitting, Cuff Size: Small)   Pulse (!) 54   Temp 97.6 F (36.4 C) (Oral)   Resp 16   Ht _0  (1.753 m)   Wt 217 lb (98.4 kg)   SpO2 99%   BMI 32.05 kg/m       Objective:   Physical Exam Constitutional:      General: He is not in acute distress.    Appearance: He is well-developed.  HENT:     Head: Normocephalic and atraumatic.  Cardiovascular:     Rate and Rhythm: Normal rate and regular rhythm.       Heart sounds: No murmur.  Pulmonary:     Effort: Pulmonary effort is normal. No respiratory distress.     Breath sounds: Normal breath sounds. No wheezing or rales.  Skin:  General: Skin is warm and dry.  Neurological:     Mental Status: He is alert and oriented to person, place, and time.  Psychiatric:        Behavior: Behavior normal.        Thought Content: Thought content normal.           Assessment & Plan:  Hypertension-blood pressure is stable on current medications continue same.  Diabetes type 2-control is unknown as he is not checking his sugars.  Will obtain follow-up A1c.  Further recommendations after review of the summer.  We did discuss need for colon cancer screening he has not contacted his insurance to discuss Cologuard coverage.  I have encouraged him to do so and get back to me when he is ready to proceed.  He declines colonoscopy.  He also declines flu shot.  Hyperlipidemia-last visit triglycerides were mildly elevated likely related to DM2.  Continue statin.  LDL was at goal.

## 2018-04-08 NOTE — Telephone Encounter (Signed)
Sugar is improving but still above goal. I would like him to check sugar 1-2 times daily.  Increase basaglar from 10 units to 15 units daily. Call if sugars <80.

## 2018-04-08 NOTE — Patient Instructions (Signed)
Please complete lab work prior to leaving.   

## 2018-04-10 NOTE — Telephone Encounter (Signed)
Advised patient of results and new dose. He will call us of sugar above 80

## 2018-04-13 ENCOUNTER — Other Ambulatory Visit: Payer: Self-pay | Admitting: Family

## 2018-04-13 MED ORDER — AMLODIPINE BESYLATE 10 MG PO TABS: ORAL_TABLET | ORAL | 0 refills | Status: DC

## 2018-04-13 MED ORDER — METFORMIN HCL 1000 MG PO TABS: ORAL_TABLET | ORAL | 0 refills | Status: DC

## 2018-04-13 MED ORDER — CARVEDILOL 6.25 MG PO TABS: ORAL_TABLET | ORAL | 0 refills | Status: DC

## 2018-04-13 MED ORDER — LISINOPRIL-HYDROCHLOROTHIAZIDE 20-25 MG PO TABS: 1.0000 | ORAL_TABLET | Freq: Every day | ORAL | 0 refills | Status: DC

## 2018-04-13 MED ORDER — CANAGLIFLOZIN 100 MG PO TABS: ORAL_TABLET | ORAL | 0 refills | Status: DC

## 2018-04-13 NOTE — Telephone Encounter (Signed)
Copied from CRM [id number]. Topic: Quick Communication - Rx Refill/Question >> Apr 13, 2018  3:59 PM Jilda Roche wrote: Medication: metFORMIN (GLUCOPHAGE) 1000 MG tablet, INVOKANA 100 MG TABS tablet ,lisinopril-hydrochlorothiazide (PRINZIDE,ZESTORETIC) 20-25 MG tablet,Insulin Glargine (BASAGLAR KWIKPEN) 100 UNIT/ML SOPN ,carvedilol (COREG) 6.25 MG tablet, amLODipine (NORVASC) 10 MG tablet   Has the patient contacted their pharmacy? Yes.   (Agent: If no, request that the patient contact the pharmacy for the refill.) (Agent: If yes, when and what did the pharmacy advise?) Call office for refills  Preferred Pharmacy (with phone number or street name): [id number] - [place], [place] - [address] AT [address] OF [address] [phone] (Phone) [phone] (Fax)    Agent: Please be advised that RX refills may take up to 3 business days. We ask that you follow-up with your pharmacy.

## 2018-06-23 ENCOUNTER — Telehealth: Payer: Self-pay | Admitting: Family

## 2018-06-23 NOTE — Telephone Encounter (Signed)
Pt's wife left a vm stating that pharmacy will not fill pt's rx for his basaglar. She is concerned that he will run out. If someone could reach out to pt's pharmacy and to pt with update of whats going on.

## 2018-06-24 ENCOUNTER — Other Ambulatory Visit: Payer: Self-pay

## 2018-06-24 MED ORDER — BASAGLAR KWIKPEN 100 UNIT/ML ~~LOC~~ SOPN: 15.0000 [IU] | PEN_INJECTOR | Freq: Every day | SUBCUTANEOUS | 3 refills | Status: DC

## 2018-06-24 NOTE — Telephone Encounter (Signed)
Per pharmacist, medication is not getting covered by his insurance. It was last filled in January and patient had a 25 dollars co-pay. Pharmacist reports she is running refill the same way and they are denying coverage this time. Advised patient to call his insurance company to find out reason for denial.

## 2018-06-24 NOTE — Telephone Encounter (Signed)
Patient's medication list indicates he has refills for Basaglar. Will contact pharmacy later when open to see why is rx not getting refilled.

## 2018-07-14 ENCOUNTER — Other Ambulatory Visit: Payer: Self-pay | Admitting: Family

## 2018-07-15 ENCOUNTER — Ambulatory Visit (INDEPENDENT_AMBULATORY_CARE_PROVIDER_SITE_OTHER): Payer: BLUE CROSS/BLUE SHIELD | Admitting: Family

## 2018-07-15 ENCOUNTER — Other Ambulatory Visit: Payer: Self-pay

## 2018-07-15 DIAGNOSIS — E1121 Type 2 diabetes mellitus with diabetic nephropathy: Secondary | ICD-10-CM

## 2018-07-15 DIAGNOSIS — I10 Essential (primary) hypertension: Secondary | ICD-10-CM | POA: Diagnosis not present

## 2018-07-15 DIAGNOSIS — E785 Hyperlipidemia, unspecified: Secondary | ICD-10-CM

## 2018-07-15 MED ORDER — CLONIDINE HCL 0.1 MG PO TABS: ORAL_TABLET | ORAL | 1 refills | Status: DC

## 2018-07-15 MED ORDER — METFORMIN HCL 1000 MG PO TABS: ORAL_TABLET | ORAL | 1 refills | Status: DC

## 2018-07-15 MED ORDER — LISINOPRIL-HYDROCHLOROTHIAZIDE 20-25 MG PO TABS: 1.0000 | ORAL_TABLET | Freq: Every day | ORAL | 1 refills | Status: DC

## 2018-07-15 MED ORDER — CANAGLIFLOZIN 100 MG PO TABS: ORAL_TABLET | ORAL | 0 refills | Status: DC

## 2018-07-15 MED ORDER — TADALAFIL 10 MG PO TABS: 10.0000 mg | ORAL_TABLET | Freq: Every day | ORAL | 5 refills | Status: DC | PRN

## 2018-07-15 MED ORDER — CARVEDILOL 6.25 MG PO TABS: ORAL_TABLET | ORAL | 0 refills | Status: DC

## 2018-07-15 MED ORDER — PRAVASTATIN SODIUM 40 MG PO TABS: 40.0000 mg | ORAL_TABLET | Freq: Every day | ORAL | 1 refills | Status: DC

## 2018-07-15 MED ORDER — AMLODIPINE BESYLATE 10 MG PO TABS: ORAL_TABLET | ORAL | 1 refills | Status: DC

## 2018-07-15 NOTE — Progress Notes (Signed)
Virtual Visit via Video Note  I connected with Paul Yu on 07/15/18 at  9:00 AM EDT by a video enabled telemedicine application and verified that I am speaking with the correct person using two identifiers. This visit type was conducted due to national recommendations for restrictions regarding the COVID-19 Pandemic (e.g. social distancing).  This format is felt to be most appropriate for this patient at this time.   I discussed the limitations of evaluation and management by telemedicine and the availability of in person appointments. The patient expressed understanding and agreed to proceed.  Only the patient and myself were on today's video visit. The patient was at home and I was in my office at the time of today's visit.   History of Present Illness:  DM2- He reports that his sugars have been 80-120 in the AM. Reports that he is using basaglar 15 units. Reports one episode of sugar 77 and he hadn't eaten. Was not symptomatic. Pt also continues metformin and invokana.  Lab Results  Component Value Date   HGBA1C 7.8 (H) 04/08/2018   HGBA1C 8.6 (H) 10/24/2017   HGBA1C 9.9 (H) 06/06/2017   Lab Results  Component Value Date   MICROALBUR 32.2 (H) 08/11/2015   LDLCALC 84 10/24/2017   CREATININE 0.88 04/08/2018   HTN- maintained on carvedilol, clonidine, lisinopril-hctz. Reports good compliance with his bp meds. Denies sob or LE edema.  BP Readings from Last 3 Encounters:  04/08/18 123/66  10/24/17 116/71  06/06/17 (!) 170/102   Hyperlipidemia- continues pravastatin for his cholesterol. Denies myalgia.  Lab Results  Component Value Date   CHOL 168 10/24/2017   HDL 45.10 10/24/2017   LDLCALC 84 10/24/2017   LDLDIRECT 131.0 12/23/2014   TRIG 191.0 (H) 10/24/2017   CHOLHDL 4 10/24/2017       Observations/Objective:   Gen: Awake, alert, no acute distress Resp: Breathing is even and non-labored Psych: calm/pleasant demeanor Neuro: Alert and Oriented x 3, + facial  symmetry, speech is clear.   Assessment and Plan:  HTN- good med compliance, continue current meds. Last bp looked good. Plan face to face visit in 3 months.  DM2- home readings are good. Continue current meds.  Hyperlipidemia- LDL at goal, tolerating statin, continue same.    Follow Up Instructions:    I discussed the assessment and treatment plan with the patient. The patient was provided an opportunity to ask questions and all were answered. The patient agreed with the plan and demonstrated an understanding of the instructions.   The patient was advised to call back or seek an in-person evaluation if the symptoms worsen or if the condition fails to improve as anticipated.    Lemont Fillers, NP

## 2018-10-16 ENCOUNTER — Ambulatory Visit: Payer: BLUE CROSS/BLUE SHIELD | Admitting: Family

## 2018-10-23 ENCOUNTER — Other Ambulatory Visit: Payer: Self-pay

## 2018-10-23 ENCOUNTER — Encounter: Payer: Self-pay | Admitting: Family

## 2018-10-23 ENCOUNTER — Telehealth: Payer: Self-pay | Admitting: Family

## 2018-10-23 ENCOUNTER — Ambulatory Visit (INDEPENDENT_AMBULATORY_CARE_PROVIDER_SITE_OTHER): Payer: BC Managed Care – PPO | Admitting: Family

## 2018-10-23 VITALS — BP 113/65 | HR 67 | Temp 98.7°F | Resp 16 | Ht 69.0 in | Wt 214.2 lb

## 2018-10-23 DIAGNOSIS — I10 Essential (primary) hypertension: Secondary | ICD-10-CM

## 2018-10-23 DIAGNOSIS — E1129 Type 2 diabetes mellitus with other diabetic kidney complication: Secondary | ICD-10-CM

## 2018-10-23 DIAGNOSIS — E1165 Type 2 diabetes mellitus with hyperglycemia: Secondary | ICD-10-CM | POA: Diagnosis not present

## 2018-10-23 DIAGNOSIS — E785 Hyperlipidemia, unspecified: Secondary | ICD-10-CM | POA: Diagnosis not present

## 2018-10-23 DIAGNOSIS — R809 Proteinuria, unspecified: Secondary | ICD-10-CM

## 2018-10-23 DIAGNOSIS — IMO0002 Reserved for concepts with insufficient information to code with codable children: Secondary | ICD-10-CM

## 2018-10-23 LAB — LIPID PANEL
Cholesterol: 185 mg/dL (ref 0–200)
HDL: 38.8 mg/dL — ABNORMAL LOW (ref >39.00)
LDL Cholesterol: 118 mg/dL — ABNORMAL HIGH (ref 0–99)
NonHDL: 146.07
Total CHOL/HDL Ratio: 5
Triglycerides: 140 mg/dL (ref 0.0–149.0)
VLDL: 28 mg/dL (ref 0.0–40.0)

## 2018-10-23 LAB — COMPREHENSIVE METABOLIC PANEL
ALT: 19 U/L (ref 0–53)
AST: 15 U/L (ref 0–37)
Albumin: 4.2 g/dL (ref 3.5–5.2)
Alkaline Phosphatase: 108 U/L (ref 39–117)
BUN: 13 mg/dL (ref 6–23)
CO2: 30 mEq/L (ref 19–32)
Calcium: 9.1 mg/dL (ref 8.4–10.5)
Chloride: 101 mEq/L (ref 96–112)
Creatinine, Ser: 0.92 mg/dL (ref 0.40–1.50)
GFR: 83.23 mL/min (ref >60.00)
Glucose, Bld: 159 mg/dL — ABNORMAL HIGH (ref 70–99)
Potassium: 4.3 mEq/L (ref 3.5–5.1)
Sodium: 138 mEq/L (ref 135–145)
Total Bilirubin: 0.7 mg/dL (ref 0.2–1.2)
Total Protein: 6.5 g/dL (ref 6.0–8.3)

## 2018-10-23 LAB — HEMOGLOBIN A1C: Hgb A1c MFr Bld: 7.5 % — ABNORMAL HIGH (ref 4.6–6.5)

## 2018-10-23 MED ORDER — CANAGLIFLOZIN 100 MG PO TABS: ORAL_TABLET | ORAL | 0 refills | Status: DC

## 2018-10-23 MED ORDER — ONETOUCH VERIO VI STRP: ORAL_STRIP | 5 refills | Status: DC

## 2018-10-23 MED ORDER — METFORMIN HCL 1000 MG PO TABS: ORAL_TABLET | ORAL | 1 refills | Status: DC

## 2018-10-23 MED ORDER — CARVEDILOL 6.25 MG PO TABS: ORAL_TABLET | ORAL | 0 refills | Status: DC

## 2018-10-23 MED ORDER — TADALAFIL 10 MG PO TABS: 10.0000 mg | ORAL_TABLET | Freq: Every day | ORAL | 5 refills | Status: DC | PRN

## 2018-10-23 MED ORDER — BASAGLAR KWIKPEN 100 UNIT/ML ~~LOC~~ SOPN: 18.0000 [IU] | PEN_INJECTOR | Freq: Every day | SUBCUTANEOUS | 3 refills | Status: DC

## 2018-10-23 MED ORDER — CLONIDINE HCL 0.1 MG PO TABS: ORAL_TABLET | ORAL | 1 refills | Status: DC

## 2018-10-23 MED ORDER — AMLODIPINE BESYLATE 10 MG PO TABS: ORAL_TABLET | ORAL | 1 refills | Status: DC

## 2018-10-23 MED ORDER — PRAVASTATIN SODIUM 40 MG PO TABS: 40.0000 mg | ORAL_TABLET | Freq: Every day | ORAL | 1 refills | Status: DC

## 2018-10-23 NOTE — Telephone Encounter (Signed)
Please ask him to increase the insulin from 15 units to 18 units once daily as well as working on healthy diet.

## 2018-10-23 NOTE — Telephone Encounter (Signed)
Patient's advised to increase insulin from 15 to 18 units.

## 2018-10-23 NOTE — Addendum Note (Signed)
Addended by: Debbrah Alar on: 10/23/2018 03:21 PM   Modules accepted: Orders

## 2018-10-23 NOTE — Progress Notes (Signed)
Subjective:    Patient ID: Paul Yu, male    DOB: [birth year], 63 y.o.   MRN: [id number]  HPI  Patient is a 63 yr old male who presents today for follow up.  DM2- diet has been poor. Reports good compliance most days with medication. Due for vision exam. On glucophage, invokana, basaglar  Wt Readings from Last 3 Encounters:  10/23/18 214 lb 3.2 oz (97.2 kg)  04/08/18 217 lb (98.4 kg)  10/24/17 215 lb (97.5 kg)    Lab Results  Component Value Date   HGBA1C 7.8 (H) 04/08/2018   HGBA1C 8.6 (H) 10/24/2017   HGBA1C 9.9 (H) 06/06/2017   Lab Results  Component Value Date   MICROALBUR 32.2 (H) 08/11/2015   LDLCALC 84 10/24/2017   CREATININE 0.88 04/08/2018   HTN- BP meds include amlodipine 58m, coreg 6.25, lisinopril-hctz, catapres.    BP Readings from Last 3 Encounters:  10/23/18 113/65  04/08/18 123/66  10/24/17 116/71   Hyperlipidemia- maintained on pravastatin.   Review of Systems    see HPI  Past Medical History:  Diagnosis Date  . Diabetes mellitus without complication ([facility]      Social History   Socioeconomic History  . Marital status: Married    Spouse name: Not on file  . Number of children: Not on file  . Years of education: Not on file  . Highest education level: Not on file  Occupational History  . Not on file  Social Needs  . Financial resource strain: Not on file  . Food insecurity    Worry: Not on file    Inability: Not on file  . Transportation needs    Medical: Not on file    Non-medical: Not on file  Tobacco Use  . Smoking status: Never Smoker  . Smokeless tobacco: Never Used  Substance and Sexual Activity  . Alcohol use: Yes    Alcohol/week: 0.0 standard drinks    Comment: rarely,   . Drug use: No  . Sexual activity: Not on file  Lifestyle  . Physical activity    Days per week: Not on file    Minutes per session: Not on file  . Stress: Not on file  Relationships  . Social cHerbaliston phone: Not on file   Gets together: Not on file    Attends religious service: Not on file    Active member of club or organization: Not on file    Attends meetings of clubs or organizations: Not on file    Relationship status: Not on file  . Intimate partner violence    Fear of current or ex partner: Not on file    Emotionally abused: Not on file    Physically abused: Not on file    Forced sexual activity: Not on file  Other Topics Concern  . Not on file  Social History Narrative   4th marriage-    Daughter born 2002   [place] 1982-[place]   Completed 9th grade- GED   Enjoys working on cars,      Past Surgical History:  Procedure Laterality Date  . LEFT HEART CATHETERIZATION WITH CORONARY ANGIOGRAM N/A 07/15/2014   Procedure: LEFT HEART CATHETERIZATION WITH CORONARY ANGIOGRAM;  Surgeon: MSherren Mocha MD;  [facility];  Service: Cardiovascular;  Laterality: N/A;  . TONSILLECTOMY  1966    Family History  Problem Relation Age of Onset  .  Cancer Mother        ?lung  . Diabetes Maternal Grandmother   . Heart disease Neg Hx     Allergies  Allergen Reactions  . Atorvastatin Other (See Comments)    Muscle aches    Current Outpatient Medications on File Prior to Visit  Medication Sig Dispense Refill  . amLODipine (NORVASC) 10 MG tablet TAKE 1 TABLET(10 MG) BY MOUTH DAILY 90 tablet 1  . aspirin EC 81 MG tablet Take 1 tablet (81 mg total) by mouth daily.    . Blood Glucose Monitoring Suppl (ONETOUCH VERIO) w/Device KIT Test blood sugar once daily. Dx Code: E11.9 1 kit 0  . canagliflozin (INVOKANA) 100 MG TABS tablet TAKE 1 TABLET BY MOUTH BEFORE BREAKFAST 90 tablet 0  . carvedilol (COREG) 6.25 MG tablet TAKE 1 TABLET BY MOUTH TWICE DAILY WITH A MEAL 180 tablet 0  . cetirizine (ZYRTEC) 10 MG tablet Take 10 mg by mouth daily as needed for allergies.    . cloNIDine (CATAPRES) 0.1 MG tablet TAKE 1 TABLET(0.1 MG) BY MOUTH THREE TIMES DAILY  270 tablet 1  . glucose blood (ONETOUCH VERIO) test strip Use as instructed 100 each 5  . Insulin Pen Needle (B-D UF III MINI PEN NEEDLES) 31G X 5 MM MISC USE WITH INSULIN INJECTION ONCE A DAY 100 each 3  . lisinopril-hydrochlorothiazide (ZESTORETIC) 20-25 MG tablet Take 1 tablet by mouth daily. 90 tablet 1  . metFORMIN (GLUCOPHAGE) 1000 MG tablet TAKE 1 TABLET BY MOUTH TWICE DAILY WITH A MEAL 180 tablet 1  . ONETOUCH DELICA LANCETS FINE MISC Reported on 05/04/2015  11  . pravastatin (PRAVACHOL) 40 MG tablet Take 1 tablet (40 mg total) by mouth daily. 90 tablet 1  . tadalafil (CIALIS) 10 MG tablet Take 1 tablet (10 mg total) by mouth daily as needed for erectile dysfunction. 10 tablet 5  . Insulin Glargine (BASAGLAR KWIKPEN) 100 UNIT/ML SOPN Inject 0.15 mLs (15 Units total) into the skin daily. (Patient not taking: Reported on 10/23/2018) 15 mL 3   No current facility-administered medications on file prior to visit.     BP 113/65 (BP Location: Right Arm, Patient Position: Sitting, Cuff Size: Small)   Pulse 67   Temp 98.7 F (37.1 C) (Oral)   Resp 16   Ht 5' 9"  (1.753 m)   Wt 214 lb 3.2 oz (97.2 kg)   SpO2 98%   BMI 31.63 kg/m    Objective:   Physical Exam Constitutional:      General: Paul Yu is not in acute distress.    Appearance: Paul Yu is well-developed.  HENT:     Head: Normocephalic and atraumatic.  Cardiovascular:     Rate and Rhythm: Normal rate and regular rhythm.     Heart sounds: No murmur.  Pulmonary:     Effort: Pulmonary effort is normal. No respiratory distress.     Breath sounds: Normal breath sounds. No wheezing or rales.  Skin:    General: Skin is warm and dry.  Neurological:     Mental Status: Paul Yu is alert and oriented to person, place, and time.  Psychiatric:        Behavior: Behavior normal.        Thought Content: Thought content normal.           Assessment & Plan:  HTN-  bp stable on current meds. Continue same.  DM2- not checking sugars, strip rx  renewed. Obtain follow up cmet, a1c.  Hyperlipidemia- tolerating statin, obtain follow  up lipid panel.

## 2018-10-23 NOTE — Telephone Encounter (Signed)
All results given to patient, he reports he is using the insulin as prescribed  He will pick up the new dose of pravastatin.

## 2018-10-23 NOTE — Patient Instructions (Signed)
Please reschedule your eye exam. Complete lab work prior to leaving.

## 2018-10-23 NOTE — Telephone Encounter (Signed)
  A1C is down slightly (7.5) but we really would like to see it <7.  Is he taking the insulin?    Cholesterol is above goal. Please increase pravastatin to 40mg  once daily as tolerated.

## 2018-10-26 ENCOUNTER — Other Ambulatory Visit: Payer: Self-pay | Admitting: *Deleted

## 2018-10-26 MED ORDER — ONETOUCH VERIO VI STRP: ORAL_STRIP | 5 refills | Status: DC

## 2018-11-13 LAB — HM DIABETES EYE EXAM

## 2019-01-29 ENCOUNTER — Encounter: Payer: Self-pay | Admitting: Family

## 2019-01-29 ENCOUNTER — Telehealth: Payer: Self-pay | Admitting: Family

## 2019-01-29 ENCOUNTER — Other Ambulatory Visit: Payer: Self-pay

## 2019-01-29 ENCOUNTER — Ambulatory Visit (INDEPENDENT_AMBULATORY_CARE_PROVIDER_SITE_OTHER): Payer: BC Managed Care – PPO | Admitting: Family

## 2019-01-29 VITALS — BP 121/70 | HR 64 | Temp 96.6°F | Resp 16 | Ht 69.0 in | Wt 220.0 lb

## 2019-01-29 DIAGNOSIS — R809 Proteinuria, unspecified: Secondary | ICD-10-CM | POA: Diagnosis not present

## 2019-01-29 DIAGNOSIS — IMO0002 Reserved for concepts with insufficient information to code with codable children: Secondary | ICD-10-CM

## 2019-01-29 DIAGNOSIS — E1129 Type 2 diabetes mellitus with other diabetic kidney complication: Secondary | ICD-10-CM | POA: Diagnosis not present

## 2019-01-29 DIAGNOSIS — Z0000 Encounter for general adult medical examination without abnormal findings: Secondary | ICD-10-CM | POA: Diagnosis not present

## 2019-01-29 DIAGNOSIS — E785 Hyperlipidemia, unspecified: Secondary | ICD-10-CM | POA: Diagnosis not present

## 2019-01-29 DIAGNOSIS — E1165 Type 2 diabetes mellitus with hyperglycemia: Secondary | ICD-10-CM

## 2019-01-29 LAB — COMPREHENSIVE METABOLIC PANEL
ALT: 23 U/L (ref 0–53)
AST: 16 U/L (ref 0–37)
Albumin: 4.3 g/dL (ref 3.5–5.2)
Alkaline Phosphatase: 128 U/L — ABNORMAL HIGH (ref 39–117)
BUN: 13 mg/dL (ref 6–23)
CO2: 31 mEq/L (ref 19–32)
Calcium: 9.5 mg/dL (ref 8.4–10.5)
Chloride: 98 mEq/L (ref 96–112)
Creatinine, Ser: 0.88 mg/dL (ref 0.40–1.50)
GFR: 87.53 mL/min (ref >60.00)
Glucose, Bld: 172 mg/dL — ABNORMAL HIGH (ref 70–99)
Potassium: 4.8 mEq/L (ref 3.5–5.1)
Sodium: 138 mEq/L (ref 135–145)
Total Bilirubin: 0.8 mg/dL (ref 0.2–1.2)
Total Protein: 6.7 g/dL (ref 6.0–8.3)

## 2019-01-29 LAB — CBC WITH DIFFERENTIAL/PLATELET
Basophils Absolute: 0 10*3/uL (ref 0.0–0.1)
Basophils Relative: 0.5 % (ref 0.0–3.0)
Eosinophils Absolute: 0.2 10*3/uL (ref 0.0–0.7)
Eosinophils Relative: 3.2 % (ref 0.0–5.0)
HCT: 45.1 % (ref 39.0–52.0)
Hemoglobin: 15.5 g/dL (ref 13.0–17.0)
Lymphocytes Relative: 31 % (ref 12.0–46.0)
Lymphs Abs: 2.4 10*3/uL (ref 0.7–4.0)
MCHC: 34.5 g/dL (ref 30.0–36.0)
MCV: 91.2 fl (ref 78.0–100.0)
Monocytes Absolute: 1 10*3/uL (ref 0.1–1.0)
Monocytes Relative: 13 % — ABNORMAL HIGH (ref 3.0–12.0)
Neutro Abs: 4 10*3/uL (ref 1.4–7.7)
Neutrophils Relative %: 52.3 % (ref 43.0–77.0)
Platelets: 339 10*3/uL (ref 150.0–400.0)
RBC: 4.94 Mil/uL (ref 4.22–5.81)
RDW: 13.4 % (ref 11.5–15.5)
WBC: 7.6 10*3/uL (ref 4.0–10.5)

## 2019-01-29 LAB — HEPATIC FUNCTION PANEL
ALT: 23 U/L (ref 0–53)
AST: 16 U/L (ref 0–37)
Albumin: 4.3 g/dL (ref 3.5–5.2)
Alkaline Phosphatase: 128 U/L — ABNORMAL HIGH (ref 39–117)
Bilirubin, Direct: 0.1 mg/dL (ref 0.0–0.3)
Total Bilirubin: 0.8 mg/dL (ref 0.2–1.2)
Total Protein: 6.7 g/dL (ref 6.0–8.3)

## 2019-01-29 LAB — BASIC METABOLIC PANEL
BUN: 13 mg/dL (ref 6–23)
CO2: 31 mEq/L (ref 19–32)
Calcium: 9.5 mg/dL (ref 8.4–10.5)
Chloride: 98 mEq/L (ref 96–112)
Creatinine, Ser: 0.88 mg/dL (ref 0.40–1.50)
GFR: 87.53 mL/min (ref >60.00)
Glucose, Bld: 172 mg/dL — ABNORMAL HIGH (ref 70–99)
Potassium: 4.8 mEq/L (ref 3.5–5.1)
Sodium: 138 mEq/L (ref 135–145)

## 2019-01-29 LAB — LIPID PANEL
Cholesterol: 190 mg/dL (ref 0–200)
HDL: 39.6 mg/dL (ref >39.00)
LDL Cholesterol: 119 mg/dL — ABNORMAL HIGH (ref 0–99)
NonHDL: 150.85
Total CHOL/HDL Ratio: 5
Triglycerides: 157 mg/dL — ABNORMAL HIGH (ref 0.0–149.0)
VLDL: 31.4 mg/dL (ref 0.0–40.0)

## 2019-01-29 LAB — HEMOGLOBIN A1C: Hgb A1c MFr Bld: 7.9 % — ABNORMAL HIGH (ref 4.6–6.5)

## 2019-01-29 LAB — TSH: TSH: 1.44 u[IU]/mL (ref 0.35–4.50)

## 2019-01-29 MED ORDER — CANAGLIFLOZIN 100 MG PO TABS: ORAL_TABLET | ORAL | 1 refills | Status: DC

## 2019-01-29 MED ORDER — METFORMIN HCL 1000 MG PO TABS: ORAL_TABLET | ORAL | 1 refills | Status: DC

## 2019-01-29 MED ORDER — CLONIDINE HCL 0.1 MG PO TABS: ORAL_TABLET | ORAL | 1 refills | Status: DC

## 2019-01-29 MED ORDER — CARVEDILOL 6.25 MG PO TABS: ORAL_TABLET | ORAL | 1 refills | Status: DC

## 2019-01-29 MED ORDER — AMLODIPINE BESYLATE 10 MG PO TABS: ORAL_TABLET | ORAL | 1 refills | Status: DC

## 2019-01-29 MED ORDER — PRAVASTATIN SODIUM 40 MG PO TABS: 40.0000 mg | ORAL_TABLET | Freq: Every day | ORAL | 1 refills | Status: DC

## 2019-01-29 NOTE — Addendum Note (Signed)
Addended by: Debbrah Alar on: 01/29/2019 04:53 PM   Modules accepted: Orders

## 2019-01-29 NOTE — Telephone Encounter (Signed)
Form for cologuard testing sent to [facility] lab.

## 2019-01-29 NOTE — Progress Notes (Signed)
Subjective:    Patient ID: Paul Yu, male    DOB: 03-08-1956, 63 y.o.   MRN: [id number]  HPI  Patient presents today for complete physical.  Immunizations: tdap 2016, flu shot up to date, Due for shingrix- declines Diet: needs improvement Wt Readings from Last 3 Encounters:  01/29/19 220 lb (99.8 kg)  10/23/18 214 lb 3.2 oz (97.2 kg)  04/08/18 217 lb (98.4 kg)  Exercise: very little Colonoscopy: declines Vision: up to date Dental: overdue    Review of Systems  Constitutional: Positive for unexpected weight change.  HENT: Positive for tinnitus. Negative for hearing loss and rhinorrhea.   Eyes: Negative for visual disturbance.  Respiratory: Negative for cough and shortness of breath.   Cardiovascular: Negative for chest pain.  Gastrointestinal: Negative for blood in stool, constipation (occasional constipation) and diarrhea.  Genitourinary: Negative for dysuria and frequency.  Musculoskeletal: Negative for arthralgias and myalgias.  Skin: Negative for rash.  Neurological: Negative for headaches.  Hematological: Negative for adenopathy.  Psychiatric/Behavioral:       Denies depression/anxiety   Past Medical History:  Diagnosis Date  . Diabetes mellitus without complication [facility])      Social History   Socioeconomic History  . Marital status: Married    Spouse name: Not on file  . Number of children: Not on file  . Years of education: Not on file  . Highest education level: Not on file  Occupational History  . Not on file  Social Needs  . Financial resource strain: Not on file  . Food insecurity    Worry: Not on file    Inability: Not on file  . Transportation needs    Medical: Not on file    Non-medical: Not on file  Tobacco Use  . Smoking status: Never Smoker  . Smokeless tobacco: Never Used  Substance and Sexual Activity  . Alcohol use: Yes    Alcohol/week: 0.0 standard drinks    Comment: rarely,   . Drug use: No  . Sexual activity: Not on file   Lifestyle  . Physical activity    Days per week: Not on file    Minutes per session: Not on file  . Stress: Not on file  Relationships  . Social Herbalist on phone: Not on file    Gets together: Not on file    Attends religious service: Not on file    Active member of club or organization: Not on file    Attends meetings of clubs or organizations: Not on file    Relationship status: Not on file  . Intimate partner violence    Fear of current or ex partner: Not on file    Emotionally abused: Not on file    Physically abused: Not on file    Forced sexual activity: Not on file  Other Topics Concern  . Not on file  Social History Narrative   4th marriage-    Daughter born 2002   Son 1982-lives in [place]- in [place] a truck   Completed 9th grade- GED   Enjoys working on cars,      Past Surgical History:  Procedure Laterality Date  . LEFT HEART CATHETERIZATION WITH CORONARY ANGIOGRAM N/A 07/15/2014   Procedure: LEFT HEART CATHETERIZATION WITH CORONARY ANGIOGRAM;  Surgeon: Sherren Mocha, MD;  Location: [facility] C[facility] LAB;  Service: Cardiovascular;  Laterality: N/A;  . TONSILLECTOMY  1966    Family History  Problem Relation  Age of Onset  . Cancer Mother        ?lung  . Diabetes Maternal Grandmother   . Heart disease Neg Hx     Allergies  Allergen Reactions  . Atorvastatin Other (See Comments)    Muscle aches    Current Outpatient Medications on File Prior to Visit  Medication Sig Dispense Refill  . amLODipine (NORVASC) 10 MG tablet TAKE 1 TABLET(10 MG) BY MOUTH DAILY 90 tablet 1  . aspirin EC 81 MG tablet Take 1 tablet (81 mg total) by mouth daily.    . Blood Glucose Monitoring Suppl (ONETOUCH VERIO) w/Device KIT Test blood sugar once daily. Dx Code: E11.9 1 kit 0  . canagliflozin (INVOKANA) 100 MG TABS tablet TAKE 1 TABLET BY MOUTH BEFORE BREAKFAST 90 tablet 0  . carvedilol (COREG) 6.25 MG tablet TAKE 1 TABLET BY MOUTH TWICE DAILY WITH  A MEAL 180 tablet 0  . cetirizine (ZYRTEC) 10 MG tablet Take 10 mg by mouth daily as needed for allergies.    . cloNIDine (CATAPRES) 0.1 MG tablet TAKE 1 TABLET(0.1 MG) BY MOUTH THREE TIMES DAILY 270 tablet 1  . glucose blood (ONETOUCH VERIO) test strip Use to check blood sugar once a day.  Dx Code: E11.9 100 each 5  . Insulin Glargine (BASAGLAR KWIKPEN) 100 UNIT/ML SOPN Inject 0.18 mLs (18 Units total) into the skin daily. 15 mL 3  . Insulin Pen Needle (B-D UF III MINI PEN NEEDLES) 31G X 5 MM MISC USE WITH INSULIN INJECTION ONCE A DAY 100 each 3  . lisinopril-hydrochlorothiazide (ZESTORETIC) 20-25 MG tablet Take 1 tablet by mouth daily. 90 tablet 1  . metFORMIN (GLUCOPHAGE) 1000 MG tablet TAKE 1 TABLET BY MOUTH TWICE DAILY WITH A MEAL 180 tablet 1  . ONETOUCH DELICA LANCETS FINE MISC Reported on 05/04/2015  11  . pravastatin (PRAVACHOL) 40 MG tablet Take 1 tablet (40 mg total) by mouth daily. 90 tablet 1  . tadalafil (CIALIS) 10 MG tablet Take 1 tablet (10 mg total) by mouth daily as needed for erectile dysfunction. 10 tablet 5   No current facility-administered medications on file prior to visit.     BP 121/70 (BP Location: Right Arm, Patient Position: Sitting, Cuff Size: Large)   Pulse 64   Temp (!) 96.6 F (35.9 C) (Temporal)   Resp 16   Ht 5' 9"  (1.753 m)   Wt 220 lb (99.8 kg)   SpO2 98%   BMI 32.49 kg/m       Objective:   Physical Exam  Physical Exam  Constitutional: Overweight appearing white male. He is oriented to person, place, and time. He appears well-developed and well-nourished. No distress.  HENT:  Head: Normocephalic and atraumatic.  Right Ear: Tympanic membrane and ear canal normal.  Left Ear: Tympanic membrane and ear canal normal.  Mouth/Throat:Not examined, pt wearing mask for covid precautions Eyes: Pupils are equal, round, and reactive to light. No scleral icterus.  Neck: Normal range of motion. No thyromegaly present.  Cardiovascular: Normal rate and  regular rhythm.   No murmur heard. Pulmonary/Chest: Effort normal and breath sounds normal. No respiratory distress. He has no wheezes. He has no rales. He exhibits no tenderness.  Abdominal: Soft. Bowel sounds are normal. He exhibits no distension and no mass. There is no tenderness. There is no rebound and no guarding.  Musculoskeletal: He exhibits no edema.  Lymphadenopathy:    He has no cervical adenopathy.  Neurological: He is alert and oriented to person, place,  and time. He has normal patellar reflexes. He exhibits normal muscle tone. Coordination normal.  Skin: Skin is warm and dry.  Psychiatric: He has a normal mood and affect. His behavior is normal. Judgment and thought content normal.           Assessment & Plan:   Preventative care- discussed healthy diet,exercise and weight loss. Declines colo but is agreeable to cologuard. Will order.  Obtain routine lab work. Tetanus/pneumovax up to date.  Flu shot up to date.    DM2- obtain follow up A1C.  Hyperlipidemia- states that he can only take pravachol about 3 times a week due to myalgia. Will repeat lipid panel, consider changing to cresor if LDL above goal.     Assessment & Plan:

## 2019-01-29 NOTE — Telephone Encounter (Signed)
Lvm for pt to call for lab results.

## 2019-01-29 NOTE — Patient Instructions (Signed)
Please complete lab work prior to leaving. Work on healthy diet, exercise and weight loss.  

## 2019-01-29 NOTE — Telephone Encounter (Signed)
Please arrange cologuard testing for patient.  

## 2019-01-29 NOTE — Telephone Encounter (Signed)
Please advise pt, sugar uncontrolled. A1C up to 7.9. I would recommend referral to endo, referral is pended.

## 2019-02-15 ENCOUNTER — Other Ambulatory Visit: Payer: Self-pay

## 2019-02-15 MED ORDER — LISINOPRIL-HYDROCHLOROTHIAZIDE 20-25 MG PO TABS: 1.0000 | ORAL_TABLET | Freq: Every day | ORAL | 1 refills | Status: DC

## 2019-02-23 LAB — COLOGUARD: Cologuard: NEGATIVE

## 2019-04-04 DIAGNOSIS — Z1212 Encounter for screening for malignant neoplasm of rectum: Secondary | ICD-10-CM | POA: Diagnosis not present

## 2019-04-04 DIAGNOSIS — Z1211 Encounter for screening for malignant neoplasm of colon: Secondary | ICD-10-CM | POA: Diagnosis not present

## 2019-04-09 LAB — COLOGUARD: Cologuard: NEGATIVE

## 2019-04-30 ENCOUNTER — Ambulatory Visit: Payer: BC Managed Care – PPO | Admitting: Family

## 2019-06-03 ENCOUNTER — Other Ambulatory Visit: Payer: Self-pay

## 2019-06-04 ENCOUNTER — Ambulatory Visit: Payer: BC Managed Care – PPO | Admitting: Family

## 2019-06-04 ENCOUNTER — Telehealth: Payer: Self-pay | Admitting: Family

## 2019-06-04 ENCOUNTER — Other Ambulatory Visit: Payer: Self-pay

## 2019-06-04 ENCOUNTER — Encounter: Payer: Self-pay | Admitting: Family

## 2019-06-04 VITALS — BP 120/57 | HR 70 | Temp 96.4°F | Resp 16 | Ht 68.0 in | Wt 217.0 lb

## 2019-06-04 DIAGNOSIS — N529 Male erectile dysfunction, unspecified: Secondary | ICD-10-CM | POA: Diagnosis not present

## 2019-06-04 DIAGNOSIS — E1121 Type 2 diabetes mellitus with diabetic nephropathy: Secondary | ICD-10-CM | POA: Diagnosis not present

## 2019-06-04 DIAGNOSIS — I10 Essential (primary) hypertension: Secondary | ICD-10-CM

## 2019-06-04 DIAGNOSIS — E785 Hyperlipidemia, unspecified: Secondary | ICD-10-CM

## 2019-06-04 LAB — LIPID PANEL
Cholesterol: 171 mg/dL (ref 0–200)
HDL: 37.9 mg/dL — ABNORMAL LOW (ref >39.00)
LDL Cholesterol: 108 mg/dL — ABNORMAL HIGH (ref 0–99)
NonHDL: 132.64
Total CHOL/HDL Ratio: 4
Triglycerides: 124 mg/dL (ref 0.0–149.0)
VLDL: 24.8 mg/dL (ref 0.0–40.0)

## 2019-06-04 LAB — BASIC METABOLIC PANEL
BUN: 15 mg/dL (ref 6–23)
CO2: 30 mEq/L (ref 19–32)
Calcium: 9.2 mg/dL (ref 8.4–10.5)
Chloride: 99 mEq/L (ref 96–112)
Creatinine, Ser: 0.98 mg/dL (ref 0.40–1.50)
GFR: 77.22 mL/min (ref >60.00)
Glucose, Bld: 152 mg/dL — ABNORMAL HIGH (ref 70–99)
Potassium: 3.7 mEq/L (ref 3.5–5.1)
Sodium: 137 mEq/L (ref 135–145)

## 2019-06-04 LAB — HEMOGLOBIN A1C: Hgb A1c MFr Bld: 8.5 % — ABNORMAL HIGH (ref 4.6–6.5)

## 2019-06-04 MED ORDER — PRAVASTATIN SODIUM 80 MG PO TABS: 80.0000 mg | ORAL_TABLET | Freq: Every day | ORAL | 1 refills | Status: DC

## 2019-06-04 NOTE — Telephone Encounter (Signed)
Patient advised of results as well as provider's advise and change in medications dosage. He verbalized understanding and will call in one week with glucose level results.

## 2019-06-04 NOTE — Patient Instructions (Signed)
Please complete lab work prior to leaving. Work on [employer], regular exercise and weight loss.

## 2019-06-04 NOTE — Progress Notes (Signed)
Subjective:    Patient ID: Paul Yu, male    DOB: 09-Dec-1955, 64 y.o.   MRN: [id number]  HPI  Patient is a 64 yr old male who presents today for follow up.   HTN- maintained on clonidine, carvedilol, zestoretic BP Readings from Last 3 Encounters:  06/04/19 (!) 120/57  01/29/19 121/70  10/23/18 113/65   ED- uses cialis prn. Works sometimes.   DM2- maintained on basaglar 18 units once daily, invokana and metformin. Reports that he has been cheating more often with cake. Eats less candy than he used to.  No formal exercise. Reports that he only checks sugars if he is not feeling well.  Typically sugar is 150-200.   Wt Readings from Last 3 Encounters:  06/04/19 217 lb (98.4 kg)  01/29/19 220 lb (99.8 kg)  10/23/18 214 lb 3.2 oz (97.2 kg)    Lab Results  Component Value Date   HGBA1C 7.9 (H) 01/29/2019   HGBA1C 7.5 (H) 10/23/2018   HGBA1C 7.8 (H) 04/08/2018   Lab Results  Component Value Date   MICROALBUR 32.2 (H) 08/11/2015   LDLCALC 119 (H) 01/29/2019   CREATININE 0.88 01/29/2019   CREATININE 0.88 01/29/2019   Hyperlipidemia-  Lab Results  Component Value Date   CHOL 190 01/29/2019   HDL 39.60 01/29/2019   LDLCALC 119 (H) 01/29/2019   LDLDIRECT 131.0 12/23/2014   TRIG 157.0 (H) 01/29/2019   CHOLHDL 5 01/29/2019      Review of Systems See HPI  Past Medical History:  Diagnosis Date  . Diabetes mellitus without complication [facility])      Social History   Socioeconomic History  . Marital status: Married    Spouse name: Not on file  . Number of children: Not on file  . Years of education: Not on file  . Highest education level: Not on file  Occupational History  . Not on file  Tobacco Use  . Smoking status: Never Smoker  . Smokeless tobacco: Never Used  Substance and Sexual Activity  . Alcohol use: Yes    Alcohol/week: 0.0 standard drinks    Comment: rarely,   . Drug use: No  . Sexual activity: Not on file  Other Topics Concern  . Not on file   Social History Narrative   4th marriage-    Daughter born 2002   Son 1982-lives in [place]- in [place] [address] a truck   Completed 9th grade- GED   Enjoys working on cars,     Social Determinants of Radio broadcast assistant Strain:   . Difficulty of Paying Living Expenses:   Food Insecurity:   . Worried About Charity fundraiser in the Last Year:   . Arboriculturist in the Last Year:   Transportation Needs:   . Film/video editor (Medical):   Marland Kitchen Lack of Transportation (Non-Medical):   Physical Activity:   . Days of Exercise per Week:   . Minutes of Exercise per Session:   Stress:   . Feeling of Stress :   Social Connections:   . Frequency of Communication with Friends and Family:   . Frequency of Social Gatherings with Friends and Family:   . Attends Religious Services:   . Active Member of Clubs or Organizations:   . Attends Archivist Meetings:   Marland Kitchen Marital Status:   Intimate Partner Violence:   . Fear of Current or Ex-Partner:   . Emotionally Abused:   .  Physically Abused:   . Sexually Abused:     Past Surgical History:  Procedure Laterality Date  . LEFT HEART CATHETERIZATION WITH CORONARY ANGIOGRAM N/A 07/15/2014   Procedure: LEFT HEART CATHETERIZATION WITH CORONARY ANGIOGRAM;  Surgeon: Sherren Mocha, MD;  Location: [facility] CATH LAB;  Service: Cardiovascular;  Laterality: N/A;  . TONSILLECTOMY  1966    Family History  Problem Relation Age of Onset  . Cancer Mother        ?lung  . Diabetes Maternal Grandmother   . Heart disease Neg Hx     Allergies  Allergen Reactions  . Atorvastatin Other (See Comments)    Muscle aches    Current Outpatient Medications on File Prior to Visit  Medication Sig Dispense Refill  . amLODipine (NORVASC) 10 MG tablet TAKE 1 TABLET(10 MG) BY MOUTH DAILY 90 tablet 1  . aspirin EC 81 MG tablet Take 1 tablet (81 mg total) by mouth daily.    . Blood Glucose Monitoring Suppl (ONETOUCH VERIO) w/Device  KIT Test blood sugar once daily. Dx Code: E11.9 1 kit 0  . canagliflozin (INVOKANA) 100 MG TABS tablet TAKE 1 TABLET BY MOUTH BEFORE BREAKFAST 90 tablet 1  . carvedilol (COREG) 6.25 MG tablet TAKE 1 TABLET BY MOUTH TWICE DAILY WITH A MEAL 180 tablet 1  . cetirizine (ZYRTEC) 10 MG tablet Take 10 mg by mouth daily as needed for allergies.    . cloNIDine (CATAPRES) 0.1 MG tablet TAKE 1 TABLET(0.1 MG) BY MOUTH THREE TIMES DAILY 270 tablet 1  . glucose blood (ONETOUCH VERIO) test strip Use to check blood sugar once a day.  Dx Code: E11.9 100 each 5  . Insulin Glargine (BASAGLAR KWIKPEN) 100 UNIT/ML SOPN Inject 0.18 mLs (18 Units total) into the skin daily. 15 mL 3  . Insulin Pen Needle (B-D UF III MINI PEN NEEDLES) 31G X 5 MM MISC USE WITH INSULIN INJECTION ONCE A DAY 100 each 3  . lisinopril-hydrochlorothiazide (ZESTORETIC) 20-25 MG tablet Take 1 tablet by mouth daily. 90 tablet 1  . metFORMIN (GLUCOPHAGE) 1000 MG tablet TAKE 1 TABLET BY MOUTH TWICE DAILY WITH A MEAL 180 tablet 1  . ONETOUCH DELICA LANCETS FINE MISC Reported on 05/04/2015  11  . pravastatin (PRAVACHOL) 40 MG tablet Take 1 tablet (40 mg total) by mouth daily. 90 tablet 1  . tadalafil (CIALIS) 10 MG tablet Take 1 tablet (10 mg total) by mouth daily as needed for erectile dysfunction. 10 tablet 5   No current facility-administered medications on file prior to visit.    BP (!) 120/57 (BP Location: Right Arm, Patient Position: Sitting, Cuff Size: Large)   Pulse 70   Temp (!) 96.4 F (35.8 C) (Temporal)   Resp 16   Ht '5\' 8"'$  (1.727 m)   Wt 217 lb (98.4 kg)   SpO2 99%   BMI 32.99 kg/m       Objective:   Physical Exam Constitutional:      General: He is not in acute distress.    Appearance: He is well-developed.  HENT:     Head: Normocephalic and atraumatic.  Cardiovascular:     Rate and Rhythm: Normal rate and regular rhythm.     Heart sounds: No murmur.  Pulmonary:     Effort: Pulmonary effort is normal. No respiratory  distress.     Breath sounds: Normal breath sounds. No wheezing or rales.  Skin:    General: Skin is warm and dry.  Neurological:     Mental Status:  He is alert and oriented to person, place, and time.  Psychiatric:        Behavior: Behavior normal.        Thought Content: Thought content normal.           Assessment & Plan:  HTN- bp stable on current regimen. Continue same.  DM2- above goal. Not eating well, not exercising. Discussed diet/exercise/weight loss and risk of poorly controlled DM2.  Obtain follow up A1C.  Hyperlipidemia- last LDL was above goal.  Obtain follow up lipid panel.   ED- fair response to cialis. He declines referral to urology at this time.   Patient was advised to quarantine as follows following positive COVID-19 result:  At least 10 days have passed since symptom onset and At least 24 hours have passed since resolution of fever without the use of fever-reducing medications and Other symptoms have improved.

## 2019-06-04 NOTE — Telephone Encounter (Signed)
Please contact patient and advise him that his sugar control has worsened and his cholesterol is above goal.  I would recommend the following:  1.  Increase Basaglar to 22 units once daily.  Check blood sugar once daily for 1 week in the AM.  Call me with these readings or send via MyChart for further instructions.  2.work on diabetic diet as we discussed at his appointment.  Increase pravastatin from 40 mg to 80 mg once daily.  I am sending a new prescription today.

## 2019-07-08 ENCOUNTER — Telehealth: Payer: Self-pay

## 2019-07-08 ENCOUNTER — Other Ambulatory Visit: Payer: Self-pay | Admitting: Family

## 2019-07-08 MED ORDER — BASAGLAR KWIKPEN 100 UNIT/ML ~~LOC~~ SOPN: 18.0000 [IU] | PEN_INJECTOR | Freq: Every day | SUBCUTANEOUS | 3 refills | Status: DC

## 2019-07-08 NOTE — Telephone Encounter (Signed)
Medication: Insulin Glargine (BASAGLAR KWIKPEN) 100 UNIT/ML SOPN [[id number]]    Has the patient contacted their pharmacy? Yes.   (If no, request that the patient contact the pharmacy for the refill.) (If yes, when and what did the pharmacy advise?)  Preferred Pharmacy (with phone number or street name):  [id number] - [place] - [address] AT [facility] [address]  [address], [place] [address]  Phone:  [phone] Fax:  [phone]  DEA #:  [id number] Agent: Please be advised that RX refills may take up to 3 business days. We ask that you follow-up with your pharmacy.

## 2019-07-08 NOTE — Telephone Encounter (Signed)
Medication sent.

## 2019-07-08 NOTE — Telephone Encounter (Signed)
Patient called in to see if NP Peggyann Juba could send in a prescription in for Insulin Glargine [facility]) 100 UNIT/ML SOPN [[id number]]    Please send it to [id number] - [place], [place] - [address] AT [facility] [address]  [address], [place] [address]  Phone:  [phone] Fax:  [phone]  DEA #:  [id number]

## 2019-07-12 ENCOUNTER — Other Ambulatory Visit: Payer: Self-pay

## 2019-07-12 MED ORDER — BASAGLAR KWIKPEN 100 UNIT/ML ~~LOC~~ SOPN: 18.0000 [IU] | PEN_INJECTOR | Freq: Every day | SUBCUTANEOUS | 3 refills | Status: DC

## 2019-07-13 NOTE — Telephone Encounter (Signed)
Patient: Paul Yu   Medication : Insulin Glargine (basaglar kwikpen 100unit /ml sopn   Calling in reference to insulin re- fill sent in to pharmacy, patient was advised to take 23 units a day by  [facility], per patient perception he pick up from yesterday patient was instructed to take 18 units a day.   Please advise

## 2019-08-10 ENCOUNTER — Telehealth: Payer: Self-pay | Admitting: Family

## 2019-08-10 MED ORDER — BASAGLAR KWIKPEN 100 UNIT/ML ~~LOC~~ SOPN: 23.0000 [IU] | PEN_INJECTOR | Freq: Every day | SUBCUTANEOUS | 3 refills | Status: DC

## 2019-08-10 NOTE — Telephone Encounter (Signed)
Please send new dose with a 90 day supply

## 2019-08-10 NOTE — Telephone Encounter (Signed)
Please advise pt that I have sent a new rx with 23 unit dosing instructions and a 3 month supply to [facility].

## 2019-08-10 NOTE — Telephone Encounter (Signed)
Patient states that when [facility] upped his [facility]) 100 UNIT/ML from 18 ml to 23 ml the update never went to pharmacy and patient is now out. He tried to get a refill and they are charging him  $35 a pen.  And a  $57 Penalty for it not being on a 90 day supply order. Please contact back and advise

## 2019-08-11 NOTE — Telephone Encounter (Signed)
Patient called this morning to check status of percrtiption for insulin pen.   Patient instructed to check with [facility], new prescription sent in .

## 2019-09-10 ENCOUNTER — Ambulatory Visit: Payer: BC Managed Care – PPO | Admitting: Family

## 2019-09-15 ENCOUNTER — Encounter: Payer: Self-pay | Admitting: Family

## 2019-09-15 ENCOUNTER — Other Ambulatory Visit: Payer: Self-pay

## 2019-09-15 ENCOUNTER — Ambulatory Visit: Payer: BC Managed Care – PPO | Admitting: Family

## 2019-09-15 VITALS — BP 120/78 | HR 60 | Temp 97.4°F | Resp 18 | Ht 68.0 in | Wt 199.0 lb

## 2019-09-15 DIAGNOSIS — I10 Essential (primary) hypertension: Secondary | ICD-10-CM

## 2019-09-15 DIAGNOSIS — E1121 Type 2 diabetes mellitus with diabetic nephropathy: Secondary | ICD-10-CM | POA: Diagnosis not present

## 2019-09-15 DIAGNOSIS — E785 Hyperlipidemia, unspecified: Secondary | ICD-10-CM | POA: Diagnosis not present

## 2019-09-15 NOTE — Patient Instructions (Signed)
Please work on getting your covid vaccine. Complete lab work prior to leaving. Keep up the good work with healthy diet, exercise and weight loss.

## 2019-09-15 NOTE — Progress Notes (Signed)
Subjective:    Patient ID: Paul Yu, male    DOB: 12-11-1955, 64 y.o.   MRN: [id number]  HPI  Patient is a 64 yr old male who presents today for follow up.  DM2- maintained on basaglar, invokana and metformin.  Lab Results  Component Value Date   HGBA1C 6.4 09/15/2019   HGBA1C 8.5 (H) 06/04/2019   HGBA1C 7.9 (H) 01/29/2019   Lab Results  Component Value Date   MICROALBUR 32.2 (H) 08/11/2015   LDLCALC 108 (H) 06/04/2019   CREATININE 1.18 09/15/2019   He has been working hard on diet and weight loss.   Wt Readings from Last 3 Encounters:  09/15/19 199 lb (90.3 kg)  06/04/19 217 lb (98.4 kg)  01/29/19 220 lb (99.8 kg)    Last visit we increased his basaglar to 22 units once daily. He is currently taking 23 units.  Lab Results  Component Value Date   HGBA1C 8.5 (H) 06/04/2019   HGBA1C 7.9 (H) 01/29/2019   HGBA1C 7.5 (H) 10/23/2018   Lab Results  Component Value Date   MICROALBUR 32.2 (H) 08/11/2015   LDLCALC 108 (H) 06/04/2019   CREATININE 0.98 06/04/2019   Hypertension- maintained on clonidine, carvedilol and zestoretic.  BP Readings from Last 3 Encounters:  09/15/19 120/78  06/04/19 (!) 120/57  01/29/19 121/70   Hyperlipidemia- Last visit pravastatin was increased from 63m to 817m Reports he could not tolerate the 80.  Lab Results  Component Value Date   CHOL 171 06/04/2019   HDL 37.90 (L) 06/04/2019   LDLCALC 108 (H) 06/04/2019   LDLDIRECT 131.0 12/23/2014   TRIG 124.0 06/04/2019   CHOLHDL 4 06/04/2019     Review of Systems See HPI  Past Medical History:  Diagnosis Date  . [facility]     Social History   Socioeconomic History  . Marital status: Married    Spouse name: Not on file  . Number of children: Not on file  . Years of education: Not on file  . Highest education level: Not on file  Occupational History  . Not on file  Tobacco Use  . Smoking status: Never Smoker  . Smokeless tobacco:  Never Used  Substance and Sexual Activity  . Alcohol use: Yes    Alcohol/week: 0.0 standard drinks    Comment: rarely,   . Drug use: No  . Sexual activity: Not on file  Other Topics Concern  . Not on file  Social History Narrative   4th marriage-    Daughter born 2002   [place] 1982[place]   Completed 9th grade- GED   Enjoys working on cars,     Social Determinants of HeRadio broadcast assistanttrain:   . Difficulty of Paying Living Expenses:   Food Insecurity:   . Worried About RuCharity fundraisern the Last Year:   . RaArboriculturistn the Last Year:   Transportation Needs:   . LaFilm/video editorMedical):   . Marland Kitchenack of Transportation (Non-Medical):   Physical Activity:   . Days of Exercise per Week:   . Minutes of Exercise per Session:   Stress:   . Feeling of Stress :   Social Connections:   . Frequency of Communication with Friends and Family:   . Frequency of Social Gatherings with Friends and Family:   . Attends Religious Services:   .  Active Member of Clubs or Organizations:   . Attends Archivist Meetings:   Marland Kitchen Marital Status:   Intimate Partner Violence:   . Fear of Current or Ex-Partner:   . Emotionally Abused:   Marland Kitchen Physically Abused:   . Sexually Abused:     Past Surgical History:  Procedure Laterality Date  . LEFT HEART CATHETERIZATION WITH CORONARY ANGIOGRAM N/A 07/15/2014   Procedure: LEFT HEART CATHETERIZATION WITH CORONARY ANGIOGRAM;  Surgeon: Sherren Mocha, MD;  Location: [facility] CATH LAB;  Service: Cardiovascular;  Laterality: N/A;  . TONSILLECTOMY  1966    Family History  Problem Relation Age of Onset  . Cancer Mother        ?lung  . Diabetes Maternal Grandmother   . Heart disease Neg Hx     Allergies  Allergen Reactions  . Atorvastatin Other (See Comments)    Muscle aches    Current Outpatient Medications on File Prior to Visit  Medication Sig Dispense Refill  .  amLODipine (NORVASC) 10 MG tablet TAKE 1 TABLET(10 MG) BY MOUTH DAILY 90 tablet 1  . aspirin EC 81 MG tablet Take 1 tablet (81 mg total) by mouth daily.    . Blood Glucose Monitoring Suppl (ONETOUCH VERIO) w/Device KIT Test blood sugar once daily. Dx Code: E11.9 1 kit 0  . canagliflozin (INVOKANA) 100 MG TABS tablet TAKE 1 TABLET BY MOUTH BEFORE BREAKFAST 90 tablet 1  . carvedilol (COREG) 6.25 MG tablet TAKE 1 TABLET BY MOUTH TWICE DAILY WITH A MEAL 180 tablet 1  . cetirizine (ZYRTEC) 10 MG tablet Take 10 mg by mouth daily as needed for allergies.    . cloNIDine (CATAPRES) 0.1 MG tablet TAKE 1 TABLET(0.1 MG) BY MOUTH THREE TIMES DAILY 270 tablet 1  . glucose blood (ONETOUCH VERIO) test strip Use to check blood sugar once a day.  Dx Code: E11.9 100 each 5  . Insulin Glargine (BASAGLAR KWIKPEN) 100 UNIT/ML Inject 0.23 mLs (23 Units total) into the skin daily. 21 mL 3  . Insulin Pen Needle (B-D UF III MINI PEN NEEDLES) 31G X 5 MM MISC USE WITH INSULIN INJECTION ONCE A DAY 100 each 3  . lisinopril-hydrochlorothiazide (ZESTORETIC) 20-25 MG tablet Take 1 tablet by mouth daily. 90 tablet 1  . metFORMIN (GLUCOPHAGE) 1000 MG tablet TAKE 1 TABLET BY MOUTH TWICE DAILY WITH A MEAL 180 tablet 1  . ONETOUCH DELICA LANCETS FINE MISC Reported on 05/04/2015  11  . pravastatin (PRAVACHOL) 80 MG tablet Take 1 tablet (80 mg total) by mouth daily. 90 tablet 1  . tadalafil (CIALIS) 10 MG tablet Take 1 tablet (10 mg total) by mouth daily as needed for erectile dysfunction. 10 tablet 5   No current facility-administered medications on file prior to visit.    BP 120/78 (BP Location: Right Arm, Patient Position: Sitting, Cuff Size: Normal)   Pulse 60   Temp (!) 97.4 F (36.3 C) (Temporal)   Resp 18   Ht 5' 8"  (1.727 m)   Wt 199 lb (90.3 kg)   SpO2 98%   BMI 30.26 kg/m       Objective:   Physical Exam Constitutional:      General: He is not in acute distress.    Appearance: He is well-developed.  HENT:      Head: Normocephalic and atraumatic.  Cardiovascular:     Rate and Rhythm: Normal rate and regular rhythm.     Heart sounds: No murmur heard.   Pulmonary:  Effort: Pulmonary effort is normal. No respiratory distress.     Breath sounds: Normal breath sounds. No wheezing or rales.  Skin:    General: Skin is warm and dry.  Neurological:     Mental Status: He is alert and oriented to person, place, and time.  Psychiatric:        Behavior: Behavior normal.        Thought Content: Thought content normal.           Assessment & Plan:  DM2-  Lab Results  Component Value Date   HGBA1C 6.4 09/15/2019   A1C is significantly improved with his weight loss.  Need to monitor for hypoglycemia. If sugars begin to dip may need to adjust insulin.  HTN- bp stable on current regimen. Continue same.  Hyperlipidemia- LDL slightly above goal, but he could not tolerate increased dose of statin.  Continue reduced dose statin and work on healthy diet/weight loss.  This visit occurred during the SARS-CoV-2 public health emergency.  Safety protocols were in place, including screening questions prior to the visit, additional usage of staff PPE, and extensive cleaning of exam room while observing appropriate contact time as indicated for disinfecting solutions.

## 2019-09-16 LAB — HEMOGLOBIN A1C: Hgb A1c MFr Bld: 6.4 % (ref 4.6–6.5)

## 2019-09-16 LAB — BASIC METABOLIC PANEL
BUN: 21 mg/dL (ref 6–23)
CO2: 30 mEq/L (ref 19–32)
Calcium: 9.6 mg/dL (ref 8.4–10.5)
Chloride: 100 mEq/L (ref 96–112)
Creatinine, Ser: 1.18 mg/dL (ref 0.40–1.50)
GFR: 62.27 mL/min (ref >60.00)
Glucose, Bld: 114 mg/dL — ABNORMAL HIGH (ref 70–99)
Potassium: 4 mEq/L (ref 3.5–5.1)
Sodium: 139 mEq/L (ref 135–145)

## 2019-09-29 ENCOUNTER — Other Ambulatory Visit: Payer: Self-pay | Admitting: *Deleted

## 2019-09-29 MED ORDER — AMLODIPINE BESYLATE 10 MG PO TABS: ORAL_TABLET | ORAL | 1 refills | Status: DC

## 2019-09-29 MED ORDER — LISINOPRIL-HYDROCHLOROTHIAZIDE 20-25 MG PO TABS: 1.0000 | ORAL_TABLET | Freq: Every day | ORAL | 1 refills | Status: DC

## 2019-09-29 NOTE — Telephone Encounter (Signed)
Received request from [facility] for amlodipine and lisinopril hctz. Refills sent.

## 2019-09-30 ENCOUNTER — Other Ambulatory Visit: Payer: Self-pay | Admitting: *Deleted

## 2019-09-30 MED ORDER — BD PEN NEEDLE MINI U/F 31G X 5 MM MISC: 3 refills | Status: DC

## 2019-09-30 NOTE — Telephone Encounter (Signed)
Received fax from [facility] requesting refill of BD pen needle 31G x 72mm.  Refills sent.

## 2019-10-26 ENCOUNTER — Other Ambulatory Visit: Payer: Self-pay | Admitting: Family

## 2019-12-17 ENCOUNTER — Other Ambulatory Visit: Payer: BC Managed Care – PPO

## 2019-12-17 ENCOUNTER — Telehealth (INDEPENDENT_AMBULATORY_CARE_PROVIDER_SITE_OTHER): Payer: BC Managed Care – PPO | Admitting: Family

## 2019-12-17 ENCOUNTER — Other Ambulatory Visit: Payer: Self-pay

## 2019-12-17 DIAGNOSIS — E1121 Type 2 diabetes mellitus with diabetic nephropathy: Secondary | ICD-10-CM

## 2019-12-17 DIAGNOSIS — I10 Essential (primary) hypertension: Secondary | ICD-10-CM | POA: Diagnosis not present

## 2019-12-17 DIAGNOSIS — E785 Hyperlipidemia, unspecified: Secondary | ICD-10-CM | POA: Diagnosis not present

## 2019-12-17 NOTE — Progress Notes (Signed)
Virtual Visit via Video Note  I connected with Paul Yu on 12/17/19 at 11:20 AM EDT by a video enabled telemedicine application and verified that I am speaking with the correct person using two identifiers.  Location: Patient: home Provider: home   I discussed the limitations of evaluation and management by telemedicine and the availability of in person appointments. The patient expressed understanding and agreed to proceed. Only the patient and myself were present for today's video call.   History of Present Illness:  Patient is a 64 yr old male who presents today for follow up.   DM2-  Reports that his sugars have been 80's- low 100's.  Reports that he has cut "way back" on sugar and eating. He is using 23 units of basaglar.  He denies hypoglycemia. Reports that on rare occasions if he skips dinner he will also skip his basaglar doses.  Otherwise, he has noticed his AM  Sugars in the 70's. He continues invokana.   HTN-  BP Readings from Last 3 Encounters:  09/15/19 120/78  06/04/19 (!) 120/57  01/29/19 121/70   Does not have a cuff. Maintained on clonidine, carvedilol and lisinopril/hctz and amlodipine.  Hyperlipidemia- had fatigue/myalgia on pravastatin 80mg  so he decreased his dose back down to 40mg . States he is tolerating this dose without difficulty. Lab Results  Component Value Date   CHOL 171 06/04/2019   HDL 37.90 (L) 06/04/2019   LDLCALC 108 (H) 06/04/2019   LDLDIRECT 131.0 12/23/2014   TRIG 124.0 06/04/2019   CHOLHDL 4 06/04/2019    Reports that his eye exam is due. Sees [facility]  Off of 08/04/2019.    Reports last weight was 195.   Wt Readings from Last 3 Encounters:  09/15/19 199 lb (90.3 kg)  06/04/19 217 lb (98.4 kg)  01/29/19 220 lb (99.8 kg)     Lab Results  Component Value Date   HGBA1C 6.4 09/15/2019   HGBA1C 8.5 (H) 06/04/2019   HGBA1C 7.9 (H) 01/29/2019   Lab Results  Component Value Date   MICROALBUR 32.2 (H) 08/11/2015   LDLCALC 108  (H) 06/04/2019   CREATININE 1.18 09/15/2019       Observations/Objective:   Gen: Awake, alert, no acute distress Resp: Breathing is even and non-labored Psych: calm/pleasant demeanor Neuro: Alert and Oriented x 3, + facial symmetry, speech is clear.   Assessment and Plan:  DM2- clinically stable. Continue basaglar, invokana and metformin.  He will return to the clinic for A1C and BMET. I commended him on his weight loss.  He is advised to schedule a follow up eye exam.    HTN- bp control unknown (we will try to check his BP today when he comes in for labs).  Hyperlipidemia- obtain follow up lipid panel.   Follow Up Instructions:    I discussed the assessment and treatment plan with the patient. The patient was provided an opportunity to ask questions and all were answered. The patient agreed with the plan and demonstrated an understanding of the instructions.   The patient was advised to call back or seek an in-person evaluation if the symptoms worsen or if the condition fails to improve as anticipated.  08/04/2019, NP

## 2019-12-17 NOTE — Addendum Note (Signed)
Addended by: Kathryne Hitch on: 12/17/2019 03:57 PM   Modules accepted: Orders

## 2019-12-18 LAB — BASIC METABOLIC PANEL
BUN: 16 mg/dL (ref 7–25)
CO2: 30 mmol/L (ref 20–32)
Calcium: 9.9 mg/dL (ref 8.6–10.3)
Chloride: 100 mmol/L (ref 98–110)
Creat: 0.88 mg/dL (ref 0.70–1.25)
Glucose, Bld: 118 mg/dL — ABNORMAL HIGH (ref 65–99)
Potassium: 4.9 mmol/L (ref 3.5–5.3)
Sodium: 139 mmol/L (ref 135–146)

## 2019-12-18 LAB — HEMOGLOBIN A1C
Hgb A1c MFr Bld: 6.5 %{Hb} — ABNORMAL HIGH
Mean Plasma Glucose: 140 (calc)
eAG (mmol/L): 7.7 (calc)

## 2019-12-18 LAB — LIPID PANEL
Cholesterol: 130 mg/dL
HDL: 45 mg/dL
LDL Cholesterol (Calc): 68 mg/dL
Non-HDL Cholesterol (Calc): 85 mg/dL
Total CHOL/HDL Ratio: 2.9 (calc)
Triglycerides: 86 mg/dL

## 2020-03-22 ENCOUNTER — Ambulatory Visit: Payer: BC Managed Care – PPO | Admitting: Family

## 2020-03-28 ENCOUNTER — Other Ambulatory Visit: Payer: Self-pay

## 2020-03-29 ENCOUNTER — Encounter: Payer: Self-pay | Admitting: Family

## 2020-03-29 ENCOUNTER — Ambulatory Visit: Payer: BC Managed Care – PPO | Admitting: Family

## 2020-03-29 ENCOUNTER — Other Ambulatory Visit: Payer: Self-pay

## 2020-03-29 VITALS — BP 127/63 | HR 60 | Temp 98.0°F | Resp 16 | Ht 68.0 in | Wt 193.0 lb

## 2020-03-29 DIAGNOSIS — I10 Essential (primary) hypertension: Secondary | ICD-10-CM

## 2020-03-29 DIAGNOSIS — E1121 Type 2 diabetes mellitus with diabetic nephropathy: Secondary | ICD-10-CM | POA: Diagnosis not present

## 2020-03-29 DIAGNOSIS — Z23 Encounter for immunization: Secondary | ICD-10-CM

## 2020-03-29 DIAGNOSIS — E785 Hyperlipidemia, unspecified: Secondary | ICD-10-CM | POA: Diagnosis not present

## 2020-03-29 LAB — HEMOGLOBIN A1C: Hgb A1c MFr Bld: 7.1 % — ABNORMAL HIGH (ref 4.6–6.5)

## 2020-03-29 LAB — COMPREHENSIVE METABOLIC PANEL
ALT: 31 U/L (ref 0–53)
AST: 22 U/L (ref 0–37)
Albumin: 4.6 g/dL (ref 3.5–5.2)
Alkaline Phosphatase: 99 U/L (ref 39–117)
BUN: 18 mg/dL (ref 6–23)
CO2: 29 mEq/L (ref 19–32)
Calcium: 9 mg/dL (ref 8.4–10.5)
Chloride: 100 mEq/L (ref 96–112)
Creatinine, Ser: 0.81 mg/dL (ref 0.40–1.50)
GFR: 93.55 mL/min (ref >60.00)
Glucose, Bld: 155 mg/dL — ABNORMAL HIGH (ref 70–99)
Potassium: 3.9 mEq/L (ref 3.5–5.1)
Sodium: 136 mEq/L (ref 135–145)
Total Bilirubin: 0.7 mg/dL (ref 0.2–1.2)
Total Protein: 6.7 g/dL (ref 6.0–8.3)

## 2020-03-29 MED ORDER — CLONIDINE HCL 0.1 MG PO TABS: 0.1000 mg | ORAL_TABLET | Freq: Two times a day (BID) | ORAL | 1 refills | Status: DC

## 2020-03-29 NOTE — Patient Instructions (Signed)
Please complete lab work prior to leaving.   

## 2020-03-29 NOTE — Progress Notes (Signed)
Subjective:    Patient ID: Paul Yu, male    DOB: 30-Mar-1955, 65 y.o.   MRN: [id number]  HPI  Patient is a 65 yr old male who presents today for follow up.   HTN- Reports that he is compliant with medication. Uses clonidine bid.   BP Readings from Last 3 Encounters:  03/29/20 127/63  09/15/19 120/78  06/04/19 (!) 120/57   Wt Readings from Last 3 Encounters:  03/29/20 193 lb (87.5 kg)  09/15/19 199 lb (90.3 kg)  06/04/19 217 lb (98.4 kg)   DM2- taking metformin. Not taking invokana or insulin.  He discontinued both 3 months ago.  Lab Results  Component Value Date   HGBA1C 6.5 (H) 12/17/2019   HGBA1C 6.4 09/15/2019   HGBA1C 8.5 (H) 06/04/2019   Lab Results  Component Value Date   MICROALBUR 32.2 (H) 08/11/2015   LDLCALC 68 12/17/2019   CREATININE 0.88 12/17/2019    Lab Results  Component Value Date   CHOL 130 12/17/2019   HDL 45 12/17/2019   LDLCALC 68 12/17/2019   LDLDIRECT 131.0 12/23/2014   TRIG 86 12/17/2019   CHOLHDL 2.9 12/17/2019   ED- cialis is helpful.  Review of Systems See HPI  Past Medical History:  Diagnosis Date  . Diabetes mellitus without complication [facility])      Social History   Socioeconomic History  . Marital status: Married    Spouse name: Not on file  . Number of children: Not on file  . Years of education: Not on file  . Highest education level: Not on file  Occupational History  . Not on file  Tobacco Use  . Smoking status: Never Smoker  . Smokeless tobacco: Never Used  Substance and Sexual Activity  . Alcohol use: Yes    Alcohol/week: 0.0 standard drinks    Comment: rarely,   . Drug use: No  . Sexual activity: Not on file  Other Topics Concern  . Not on file  Social History Narrative   4th marriage-    Daughter born 2002   Son 1982-lives in [place]- in [place] a truck   Completed 9th grade- GED   Enjoys working on cars,     Social Determinants of Radio broadcast assistant  Strain: Not on [employer] Insecurity: Not on file  Transportation Needs: Not on file  Physical Activity: Not on file  Stress: Not on file  Social Connections: Not on file  Intimate Partner Violence: Not on file    Past Surgical History:  Procedure Laterality Date  . LEFT HEART CATHETERIZATION WITH CORONARY ANGIOGRAM N/A 07/15/2014   Procedure: LEFT HEART CATHETERIZATION WITH CORONARY ANGIOGRAM;  Surgeon: Sherren Mocha, MD;  Location: [facility] CATH LAB;  Service: Cardiovascular;  Laterality: N/A;  . TONSILLECTOMY  1966    Family History  Problem Relation Age of Onset  . Cancer Mother        ?lung  . Diabetes Maternal Grandmother   . Heart disease Neg Hx     Allergies  Allergen Reactions  . Atorvastatin Other (See Comments)    Muscle aches    Current Outpatient Medications on File Prior to Visit  Medication Sig Dispense Refill  . amLODipine (NORVASC) 10 MG tablet TAKE 1 TABLET(10 MG) BY MOUTH DAILY 90 tablet 1  . aspirin EC 81 MG tablet Take 1 tablet (81 mg total) by mouth daily.    . Blood Glucose Monitoring Suppl (ONETOUCH VERIO) w/Device  KIT Test blood sugar once daily. Dx Code: E11.9 1 kit 0  . carvedilol (COREG) 6.25 MG tablet TAKE 1 TABLET BY MOUTH TWICE DAILY WITH A MEAL 180 tablet 1  . cetirizine (ZYRTEC) 10 MG tablet Take 10 mg by mouth daily as needed for allergies.    . cloNIDine (CATAPRES) 0.1 MG tablet TAKE 1 TABLET(0.1 MG) BY MOUTH THREE TIMES DAILY 270 tablet 1  . glucose blood (ONETOUCH VERIO) test strip Use to check blood sugar once a day.  Dx Code: E11.9 100 each 5  . lisinopril-hydrochlorothiazide (ZESTORETIC) 20-25 MG tablet Take 1 tablet by mouth daily. 90 tablet 1  . metFORMIN (GLUCOPHAGE) 1000 MG tablet TAKE 1 TABLET BY MOUTH TWICE DAILY WITH A MEAL 180 tablet 1  . ONETOUCH DELICA LANCETS FINE MISC Reported on 05/04/2015  11  . pravastatin (PRAVACHOL) 80 MG tablet Take 1 tablet (80 mg total) by mouth daily. 90 tablet 1  . tadalafil (CIALIS) 10 MG tablet Take 1  tablet (10 mg total) by mouth daily as needed for erectile dysfunction. 10 tablet 5  . canagliflozin (INVOKANA) 100 MG TABS tablet TAKE 1 TABLET BY MOUTH BEFORE BREAKFAST (Patient not taking: Reported on 03/29/2020) 90 tablet 1  . Insulin Glargine (BASAGLAR KWIKPEN) 100 UNIT/ML Inject 0.23 mLs (23 Units total) into the skin daily. (Patient not taking: Reported on 03/29/2020) 21 mL 3  . Insulin Pen Needle (B-D UF III MINI PEN NEEDLES) 31G X 5 MM MISC USE WITH INSULIN INJECTION ONCE A DAY (Patient not taking: Reported on 03/29/2020) 100 each 3   No current facility-administered medications on file prior to visit.    BP 127/63 (BP Location: Right Arm, Patient Position: Sitting, Cuff Size: Small)   Pulse 60   Temp 98 F (36.7 C) (Oral)   Resp 16   Ht 5\' 8"  (1.727 m)   Wt 193 lb (87.5 kg)   SpO2 99%   BMI 29.35 kg/m       Objective:   Physical Exam Constitutional:      General: He is not in acute distress.    Appearance: He is well-developed and well-nourished.  HENT:     Head: Normocephalic and atraumatic.  Cardiovascular:     Rate and Rhythm: Normal rate and regular rhythm.     Heart sounds: No murmur heard.   Pulmonary:     Effort: Pulmonary effort is normal. No respiratory distress.     Breath sounds: Normal breath sounds. No wheezing or rales.  Musculoskeletal:        General: No edema.  Skin:    General: Skin is warm and dry.  Neurological:     Mental Status: He is alert and oriented to person, place, and time.  Psychiatric:        Mood and Affect: Mood and affect normal.        Behavior: Behavior normal.        Thought Content: Thought content normal.           Assessment & Plan:  Shingrix #1 and flu shot today  HTN- bp stable. Continue zestoretic 20-25, clonidine 0.1mg  bid, coreg 6.25 bid, amlodipine 10mg  once daily.  Obtain follow up cmet.  DM2- he has lost a considerable amount of weight. Reports well controlled home sugars despite his decision to discontinue  invokana and basaglar. Will have him continue metformin 1000mg  bid and check A1C.    Hyperlipidemia- will obtain follow up lipid panel, continue pravastatin 80mg .   This visit occurred  during the SARS-CoV-2 public health emergency.  Safety protocols were in place, including screening questions prior to the visit, additional usage of staff PPE, and extensive cleaning of exam room while observing appropriate contact time as indicated for disinfecting solutions.

## 2020-04-13 ENCOUNTER — Telehealth (INDEPENDENT_AMBULATORY_CARE_PROVIDER_SITE_OTHER): Payer: BC Managed Care – PPO | Admitting: Family

## 2020-04-13 ENCOUNTER — Encounter: Payer: Self-pay | Admitting: Family

## 2020-04-13 ENCOUNTER — Telehealth: Payer: Self-pay | Admitting: Family

## 2020-04-13 DIAGNOSIS — B349 Viral infection, unspecified: Secondary | ICD-10-CM | POA: Diagnosis not present

## 2020-04-13 MED ORDER — LIDOCAINE VISCOUS HCL 2 % MT SOLN: 10.0000 mL | Freq: Four times a day (QID) | OROMUCOSAL | 0 refills | Status: DC | PRN

## 2020-04-13 NOTE — Telephone Encounter (Signed)
Called patient back and got him scheduled for 11 am virtual visit

## 2020-04-13 NOTE — Progress Notes (Signed)
Virtual Visit via Telephone Note  I connected with Paul Yu on 04/13/20 at 11:00 AM EST by telephone and verified that I am speaking with the correct person using two identifiers.  Location: Patient: home Provider: work    I discussed the limitations, risks, security and privacy concerns of performing an evaluation and management service by telephone and the availability of in person appointments. I also discussed with the patient that there may be a patient responsible charge related to this service. The patient expressed understanding and agreed to proceed.   History of Present Illness:  Patient reports sore throat, congestion which started Saturday evening. Denies fever.  Reports pain with swallowing but he is eating and drinking.  Congestion is in his nose. He denies fatigue. Had headache the first day which resolved with Aleve. Denies N/V. Wife tested positive for covid today on a home test.    Observations/Objective:   Gen: Awake, alert Resp: Breathing sounds even and non-labored ENT: voice hoarseness noted Psych: calm/pleasant demeanor Neuro: Alert and Oriented x 3, speech is clear.   Assessment and Plan:  Viral illness- suspect COVID-19 given household exposure. Recommend tylenol as needed for pain, viscous lidocaine 24ml PRN for sore throat.  He is advised to be tested for covid-19 and call me to let me know how his results turn out.  Advised of CDC guidelines for self isolation/ ending isolation.  Advised of safe practice guidelines. Symptom Tier reviewed.  Encouraged to monitor for any worsening symptoms; watch for increased shortness of breath, weakness, and signs of dehydration. Advised when to seek emergency care.  Instructed to rest and hydrate well.  Advised to leave the house during recommended isolation period, only if it is necessary to seek medical care   Follow Up Instructions:    I discussed the assessment and treatment plan with the patient. The patient  was provided an opportunity to ask questions and all were answered. The patient agreed with the plan and demonstrated an understanding of the instructions.   The patient was advised to call back or seek an in-person evaluation if the symptoms worsen or if the condition fails to improve as anticipated.  I provided 10  minutes of non-face-to-face time during this encounter.   Lemont Fillers, NP

## 2020-04-13 NOTE — Telephone Encounter (Signed)
Patient called back and declined virtual appointment.

## 2020-04-13 NOTE — Telephone Encounter (Signed)
Patient has severe sore throat he would like antibiotic sent to the pharmacy

## 2020-04-13 NOTE — Telephone Encounter (Signed)
Called but no answer Lvm for patient to call back, I'll try to get him to complete a virtual visit for proper assessment and treatment if indicated.

## 2020-04-22 ENCOUNTER — Other Ambulatory Visit: Payer: Self-pay | Admitting: Family

## 2020-04-26 ENCOUNTER — Other Ambulatory Visit: Payer: Self-pay | Admitting: Family

## 2020-06-28 ENCOUNTER — Ambulatory Visit: Payer: BC Managed Care – PPO | Admitting: Family

## 2020-06-28 ENCOUNTER — Encounter: Payer: Self-pay | Admitting: Family

## 2020-06-28 ENCOUNTER — Other Ambulatory Visit: Payer: Self-pay

## 2020-06-28 VITALS — BP 102/59 | HR 69 | Temp 98.3°F | Resp 16 | Ht 68.0 in | Wt 190.2 lb

## 2020-06-28 DIAGNOSIS — I10 Essential (primary) hypertension: Secondary | ICD-10-CM

## 2020-06-28 DIAGNOSIS — E785 Hyperlipidemia, unspecified: Secondary | ICD-10-CM | POA: Diagnosis not present

## 2020-06-28 DIAGNOSIS — E1121 Type 2 diabetes mellitus with diabetic nephropathy: Secondary | ICD-10-CM | POA: Diagnosis not present

## 2020-06-28 LAB — COMPREHENSIVE METABOLIC PANEL
ALT: 33 U/L (ref 0–53)
AST: 20 U/L (ref 0–37)
Albumin: 4.2 g/dL (ref 3.5–5.2)
Alkaline Phosphatase: 98 U/L (ref 39–117)
BUN: 14 mg/dL (ref 6–23)
CO2: 30 mEq/L (ref 19–32)
Calcium: 9.4 mg/dL (ref 8.4–10.5)
Chloride: 98 mEq/L (ref 96–112)
Creatinine, Ser: 0.97 mg/dL (ref 0.40–1.50)
GFR: 82.69 mL/min (ref >60.00)
Glucose, Bld: 246 mg/dL — ABNORMAL HIGH (ref 70–99)
Potassium: 4.5 mEq/L (ref 3.5–5.1)
Sodium: 136 mEq/L (ref 135–145)
Total Bilirubin: 0.8 mg/dL (ref 0.2–1.2)
Total Protein: 6.5 g/dL (ref 6.0–8.3)

## 2020-06-28 LAB — HEMOGLOBIN A1C: Hgb A1c MFr Bld: 7.5 % — ABNORMAL HIGH (ref 4.6–6.5)

## 2020-06-28 MED ORDER — CARVEDILOL 3.125 MG PO TABS: 3.1250 mg | ORAL_TABLET | Freq: Two times a day (BID) | ORAL | 3 refills | Status: DC

## 2020-06-28 MED ORDER — METFORMIN HCL 1000 MG PO TABS: ORAL_TABLET | ORAL | 1 refills | Status: DC

## 2020-06-28 MED ORDER — PRAVASTATIN SODIUM 80 MG PO TABS: 80.0000 mg | ORAL_TABLET | Freq: Every day | ORAL | 1 refills | Status: DC

## 2020-06-28 NOTE — Progress Notes (Signed)
Subjective:   By signing my name below, I, Lucille Passy, attest that this documentation has been prepared under the direction and in the presence of Debbrah Alar. 06/28/20   Patient ID: Paul Yu, male    DOB: 09-18-1955, 65 y.o.   MRN: [id number]  Chief Complaint  Patient presents with  . Diabetes    Here for follow up  . Hypertension  . Hyperlipidemia    HPI Patient is in today for office visit. He states that he is doing well, he has since recovered from [place]. Only symptom he had was sore throat. He has been maintaining his blood pressure, cholesterol, and glucose level with changes in his diet. He has not been completing any formal exercise. He plans to begin taking 10 mg of Zyrtec due to the allergy season soon. He has no new changes in his family history.   Hypertension:  He is taking 6.25 mg of carvedilol, 10 mg of amLODipine, 20-25 mg of Hydrochlorothiazide / Lisinopril, 1000 mg of Metformin and 0.1 mg of Clonidine dialy PO. He is tolerating these medications well and has no complaints at this time.  BP Readings from Last 3 Encounters:  06/28/20 (!) 102/59  03/29/20 127/63  09/15/19 120/78   Diabetes:  He only checks his glucose levels if he feels fatigue, it would usually run around 150. He has been indulging in sweets but he is doing his best to control this.  Lab Results  Component Value Date   HGBA1C 7.1 (H) 03/29/2020   Cholesterol He is taking 80 mg of Pravachol daily PO to manage his cholesterol levels. He is tolerating this well.  Lab Results  Component Value Date   CHOL 130 12/17/2019   CHOL 171 06/04/2019   CHOL 190 01/29/2019   Lab Results  Component Value Date   HDL 45 12/17/2019   HDL 37.90 (L) 06/04/2019   HDL 39.60 01/29/2019   Lab Results  Component Value Date   LDLCALC 68 12/17/2019   LDLCALC 108 (H) 06/04/2019   LDLCALC 119 (H) 01/29/2019   Lab Results  Component Value Date   TRIG 86 12/17/2019   TRIG 124.0 06/04/2019    TRIG 157.0 (H) 01/29/2019   Lab Results  Component Value Date   CHOLHDL 2.9 12/17/2019   CHOLHDL 4 06/04/2019   CHOLHDL 5 01/29/2019   Lab Results  Component Value Date   LDLDIRECT 131.0 12/23/2014     Past Medical History:  Diagnosis Date  . Diabetes mellitus without complication [facility])     Past Surgical History:  Procedure Laterality Date  . LEFT HEART CATHETERIZATION WITH CORONARY ANGIOGRAM N/A 07/15/2014   Procedure: LEFT HEART CATHETERIZATION WITH CORONARY ANGIOGRAM;  Surgeon: Sherren Mocha, MD;  Location: [facility] CATH LAB;  Service: Cardiovascular;  Laterality: N/A;  . TONSILLECTOMY  1966    Family History  Problem Relation Age of Onset  . Cancer Mother        ?lung  . Diabetes Maternal Grandmother   . Heart disease Neg Hx     Social History   Socioeconomic History  . Marital status: Married    Spouse name: Not on file  . Number of children: Not on file  . Years of education: Not on file  . Highest education level: Not on file  Occupational History  . Not on file  Tobacco Use  . Smoking status: Never Smoker  . Smokeless tobacco: Never Used  Substance and Sexual Activity  . Alcohol use: Yes  Alcohol/week: 0.0 standard drinks    Comment: rarely,   . Drug use: No  . Sexual activity: Not on file  Other Topics Concern  . Not on file  Social History Narrative   4th marriage-    Daughter born 2002   Son 1982-lives in [place]- in [place] a truck   Completed 9th grade- GED   Enjoys working on cars,     Social Determinants of Radio broadcast assistant Strain: Not on [employer] Insecurity: Not on file  Transportation Needs: Not on file  Physical Activity: Not on file  Stress: Not on file  Social Connections: Not on file  Intimate Partner Violence: Not on file    Outpatient Medications Prior to Visit  Medication Sig Dispense Refill  . amLODipine (NORVASC) 10 MG tablet TAKE 1 TABLET(10 MG) BY MOUTH DAILY 90 tablet 1  .  aspirin EC 81 MG tablet Take 1 tablet (81 mg total) by mouth daily.    . Blood Glucose Monitoring Suppl (ONETOUCH VERIO) w/Device KIT Test blood sugar once daily. Dx Code: E11.9 1 kit 0  . cetirizine (ZYRTEC) 10 MG tablet Take 10 mg by mouth daily as needed for allergies.    . cloNIDine (CATAPRES) 0.1 MG tablet TAKE 1 TABLET(0.1 MG) BY MOUTH THREE TIMES DAILY 270 tablet 1  . glucose blood (ONETOUCH VERIO) test strip Use to check blood sugar once a day.  Dx Code: E11.9 100 each 5  . Insulin Pen Needle (B-D UF III MINI PEN NEEDLES) 31G X 5 MM MISC USE WITH INSULIN INJECTION ONCE A DAY 100 each 3  . lisinopril-hydrochlorothiazide (ZESTORETIC) 20-25 MG tablet TAKE 1 TABLET BY MOUTH DAILY 90 tablet 1  . ONETOUCH DELICA LANCETS FINE MISC Reported on 05/04/2015  11  . tadalafil (CIALIS) 10 MG tablet Take 1 tablet (10 mg total) by mouth daily as needed for erectile dysfunction. 10 tablet 5  . carvedilol (COREG) 6.25 MG tablet TAKE 1 TABLET BY MOUTH TWICE DAILY WITH A MEAL 180 tablet 1  . lidocaine (XYLOCAINE) 2 % solution Use as directed 10 mLs in the mouth or throat every 6 (six) hours as needed for mouth pain. 200 mL 0  . metFORMIN (GLUCOPHAGE) 1000 MG tablet TAKE 1 TABLET BY MOUTH TWICE DAILY WITH A MEAL 180 tablet 1  . pravastatin (PRAVACHOL) 80 MG tablet Take 1 tablet (80 mg total) by mouth daily. 90 tablet 1   No facility-administered medications prior to visit.    Allergies  Allergen Reactions  . Atorvastatin Other (See Comments)    Muscle aches    Review of Systems  Constitutional: Negative for chills.  HENT: Negative for congestion, ear pain and sore throat.   Respiratory: Negative for cough.   Skin: Negative for rash.       Objective:    Physical Exam Constitutional:      General: He is not in acute distress.    Appearance: Normal appearance. He is not ill-appearing.  HENT:     Head: Normocephalic and atraumatic.     Right Ear: External ear normal.     Left Ear: External ear  normal.  Eyes:     Extraocular Movements: Extraocular movements intact.     Pupils: Pupils are equal, round, and reactive to light.  Cardiovascular:     Rate and Rhythm: Normal rate and regular rhythm.     Pulses: Normal pulses.     Heart sounds: Normal heart sounds.  No murmur heard.   Pulmonary:     Effort: Pulmonary effort is normal. No respiratory distress.     Breath sounds: Normal breath sounds. No wheezing, rhonchi or rales.  Feet:     Right foot:     Toenail Condition: Right toenails are abnormally thick.     Left foot:     Toenail Condition: Left toenails are abnormally thick.  Skin:    General: Skin is warm and dry.  Neurological:     Mental Status: He is alert and oriented to person, place, and time.  Psychiatric:        Behavior: Behavior normal.     BP (!) 102/59 (BP Location: Right Arm, Patient Position: Sitting, Cuff Size: Small)   Pulse 69   Temp 98.3 F (36.8 C) (Oral)   Resp 16   Ht $R'5\' 8"'aR$  (1.727 m)   Wt 190 lb 3.2 oz (86.3 kg)   SpO2 99%   BMI 28.92 kg/m  Wt Readings from Last 3 Encounters:  06/28/20 190 lb 3.2 oz (86.3 kg)  03/29/20 193 lb (87.5 kg)  09/15/19 199 lb (90.3 kg)    Diabetic Foot Exam - Simple   Simple Foot Form Diabetic Foot exam was performed with the following findings: Yes 06/28/2020  9:36 AM  Visual Inspection See comments: Yes Sensation Testing Intact to touch and monofilament testing bilaterally: Yes Pulse Check Posterior Tibialis and Dorsalis pulse intact bilaterally: Yes Comments Thickened, yellowed/hypertrophic toenails    Lab Results  Component Value Date   WBC 7.6 01/29/2019   HGB 15.5 01/29/2019   HCT 45.1 01/29/2019   PLT 339.0 01/29/2019   GLUCOSE 155 (H) 03/29/2020   CHOL 130 12/17/2019   TRIG 86 12/17/2019   HDL 45 12/17/2019   LDLDIRECT 131.0 12/23/2014   LDLCALC 68 12/17/2019   ALT 31 03/29/2020   AST 22 03/29/2020   NA 136 03/29/2020   K 3.9 03/29/2020   CL 100 03/29/2020   CREATININE 0.81  03/29/2020   BUN 18 03/29/2020   CO2 29 03/29/2020   TSH 1.44 01/29/2019   PSA 3.86 05/05/2015   INR 1.0 07/05/2014   HGBA1C 7.1 (H) 03/29/2020   MICROALBUR 32.2 (H) 08/11/2015    Lab Results  Component Value Date   TSH 1.44 01/29/2019   Lab Results  Component Value Date   WBC 7.6 01/29/2019   HGB 15.5 01/29/2019   HCT 45.1 01/29/2019   MCV 91.2 01/29/2019   PLT 339.0 01/29/2019   Lab Results  Component Value Date   NA 136 03/29/2020   K 3.9 03/29/2020   CO2 29 03/29/2020   GLUCOSE 155 (H) 03/29/2020   BUN 18 03/29/2020   CREATININE 0.81 03/29/2020   BILITOT 0.7 03/29/2020   ALKPHOS 99 03/29/2020   AST 22 03/29/2020   ALT 31 03/29/2020   PROT 6.7 03/29/2020   ALBUMIN 4.6 03/29/2020   CALCIUM 9.0 03/29/2020   GFR 93.55 03/29/2020   Lab Results  Component Value Date   CHOL 130 12/17/2019   Lab Results  Component Value Date   HDL 45 12/17/2019   Lab Results  Component Value Date   LDLCALC 68 12/17/2019   Lab Results  Component Value Date   TRIG 86 12/17/2019   Lab Results  Component Value Date   CHOLHDL 2.9 12/17/2019   Lab Results  Component Value Date   HGBA1C 7.1 (H) 03/29/2020       Assessment & Plan:   Problem List Items Addressed This  Visit      Unprioritized   DM2 (diabetes mellitus, type 2) ([place]) - Primary   Relevant Medications   pravastatin (PRAVACHOL) 80 MG tablet   metFORMIN (GLUCOPHAGE) 1000 MG tablet   Other Relevant Orders   Hemoglobin A1c   Comp Met (CMET)      Meds ordered this encounter  Medications  . carvedilol (COREG) 3.125 MG tablet    Sig: Take 1 tablet (3.125 mg total) by mouth 2 (two) times daily with a meal.    Dispense:  60 tablet    Refill:  3    Order Specific Question:   Supervising Provider    Answer:   Penni Homans A [[id number]]  . pravastatin (PRAVACHOL) 80 MG tablet    Sig: Take 1 tablet (80 mg total) by mouth daily.    Dispense:  90 tablet    Refill:  1    Order Specific Question:   Supervising  Provider    Answer:   Penni Homans A [[id number]]  . metFORMIN (GLUCOPHAGE) 1000 MG tablet    Sig: TAKE 1 TABLET BY MOUTH TWICE DAILY WITH A MEAL    Dispense:  180 tablet    Refill:  1    Order Specific Question:   Supervising Provider    Answer:   Penni Homans A [[id number]]    I, Nance Pear, NP, personally preformed the services described in this documentation.  All medical record entries made by the scribe were at my direction and in my presence.  I have reviewed the chart and discharge instructions (if applicable) and agree that the record reflects my personal performance and is accurate and complete. 06/28/20  I,Alexis Bryant,acting as a scribe for Nance Pear, NP.,have documented all relevant documentation on the behalf of Nance Pear, NP,as directed by  Nance Pear, NP while in the presence of Nance Pear, NP.  Nance Pear, NP

## 2020-06-28 NOTE — Assessment & Plan Note (Signed)
Last LDL at goal. Continue pravastatin 80mg  once daily.

## 2020-06-28 NOTE — Patient Instructions (Addendum)
Decrease carvedilol to 3.125mg  twice daily.  Please schedule a diabetic eye exam.  Please complete lab work prior to leaving/

## 2020-06-28 NOTE — Assessment & Plan Note (Signed)
Appears overtreated following his recent weight loss. Recommended that he decrease carvedilol from 6.25 to 3.125mg  bid.  Continue amlodipine 10mg  and lisinopril/hctz 20-25mg .

## 2020-06-28 NOTE — Assessment & Plan Note (Signed)
Discussed diabetic diet. Obtain follow up A1C. Continue metformin 500mg  bid.

## 2020-06-29 ENCOUNTER — Telehealth: Payer: Self-pay | Admitting: Family

## 2020-06-29 ENCOUNTER — Other Ambulatory Visit: Payer: Self-pay | Admitting: Family

## 2020-06-29 MED ORDER — SITAGLIPTIN PHOSPHATE 50 MG PO TABS: 50.0000 mg | ORAL_TABLET | Freq: Every day | ORAL | 5 refills | Status: DC

## 2020-06-29 NOTE — Telephone Encounter (Signed)
A1c is above goal. I would like him to add Januvia 50mg  once daily and cut back on sweets please.

## 2020-07-14 ENCOUNTER — Other Ambulatory Visit: Payer: Self-pay | Admitting: Family

## 2020-09-10 ENCOUNTER — Other Ambulatory Visit: Payer: Self-pay | Admitting: Family

## 2020-09-27 ENCOUNTER — Ambulatory Visit: Payer: BC Managed Care – PPO | Admitting: Family

## 2020-09-27 ENCOUNTER — Other Ambulatory Visit: Payer: Self-pay

## 2020-09-27 VITALS — BP 124/60 | HR 73 | Resp 17 | Ht 68.0 in | Wt 188.0 lb

## 2020-09-27 DIAGNOSIS — I420 Dilated cardiomyopathy: Secondary | ICD-10-CM

## 2020-09-27 DIAGNOSIS — Z23 Encounter for immunization: Secondary | ICD-10-CM | POA: Diagnosis not present

## 2020-09-27 DIAGNOSIS — I10 Essential (primary) hypertension: Secondary | ICD-10-CM

## 2020-09-27 DIAGNOSIS — E785 Hyperlipidemia, unspecified: Secondary | ICD-10-CM | POA: Diagnosis not present

## 2020-09-27 DIAGNOSIS — E1121 Type 2 diabetes mellitus with diabetic nephropathy: Secondary | ICD-10-CM | POA: Diagnosis not present

## 2020-09-27 DIAGNOSIS — I2510 Atherosclerotic heart disease of native coronary artery without angina pectoris: Secondary | ICD-10-CM

## 2020-09-27 LAB — BASIC METABOLIC PANEL
BUN: 25 mg/dL — ABNORMAL HIGH (ref 6–23)
CO2: 27 mEq/L (ref 19–32)
Calcium: 9.9 mg/dL (ref 8.4–10.5)
Chloride: 100 mEq/L (ref 96–112)
Creatinine, Ser: 1.1 mg/dL (ref 0.40–1.50)
GFR: 70.98 mL/min (ref >60.00)
Glucose, Bld: 183 mg/dL — ABNORMAL HIGH (ref 70–99)
Potassium: 4.8 mEq/L (ref 3.5–5.1)
Sodium: 139 mEq/L (ref 135–145)

## 2020-09-27 LAB — LIPID PANEL
Cholesterol: 151 mg/dL (ref 0–200)
HDL: 51.3 mg/dL (ref >39.00)
LDL Cholesterol: 88 mg/dL (ref 0–99)
NonHDL: 99.98
Total CHOL/HDL Ratio: 3
Triglycerides: 61 mg/dL (ref 0.0–149.0)
VLDL: 12.2 mg/dL (ref 0.0–40.0)

## 2020-09-27 LAB — HEMOGLOBIN A1C: Hgb A1c MFr Bld: 7.6 % — ABNORMAL HIGH (ref 4.6–6.5)

## 2020-09-27 NOTE — Assessment & Plan Note (Signed)
>>  ASSESSMENT AND PLAN FOR CONGESTIVE DILATED CARDIOMYOPATHY (HCC) WRITTEN ON 09/27/2020  8:20 AM BY O'SULLIVAN, Marigene Erler, NP  I urged him to let me refer him back to cardiology for ongoing management of his cardiomyopathy and moderate CAD.  He declines at this time and tells me that "I will think about it."

## 2020-09-27 NOTE — Progress Notes (Signed)
Subjective:    Patient ID: Paul Yu, male    DOB: 1956-01-22, 65 y.o.   MRN: [id number]  Diabetes   Patient is a 65 yr old male who presents today for follow up.  DM2- maintained on metformin 1025m bid.  Diet has been fair- he has lost some friends and has been stress eating. Lab Results  Component Value Date   HGBA1C 7.5 (H) 06/28/2020   HGBA1C 7.1 (H) 03/29/2020   HGBA1C 6.5 (H) 12/17/2019   Lab Results  Component Value Date   MICROALBUR 32.2 (H) 08/11/2015   LDLCALC 68 12/17/2019   CREATININE 0.97 06/28/2020   Due for Shingrix #2.    HTN- bp meds include:  coreg 3.125 bid, clonidine 0.1 TID, amlodipine 182m lisinopril-hctz 20-2562m BP Readings from Last 3 Encounters:  09/27/20 124/60  06/28/20 (!) 102/59  03/29/20 127/63   Hyperlipidemia- maintained on pravastatin 71m73mLab Results  Component Value Date   CHOL 130 12/17/2019   HDL 45 12/17/2019   LDLCALC 68 12/17/2019   LDLDIRECT 131.0 12/23/2014   TRIG 86 12/17/2019   CHOLHDL 2.9 12/17/2019       Review of Systems See HPI  Past Medical History:  Diagnosis Date   Diabetes mellitus without complication (HCC)[place]   Social History   Socioeconomic History   Marital status: Married    Spouse name: Not on file   Number of children: Not on file   Years of education: Not on file   Highest education level: Not on file  Occupational History   Not on file  Tobacco Use   Smoking status: Never   Smokeless tobacco: Never  Substance and Sexual Activity   Alcohol use: Yes    Alcohol/week: 0.0 standard drinks    Comment: rarely,    Drug use: No   Sexual activity: Not on file  Other Topics Concern   Not on file  Social History Narrative   4th marriage-    Daughter born 2002   Son 1982-[place]   Completed 9th grade- GED   Enjoys working on cars,     Social Determinants of HealRadio broadcast assistantain: Not on file  Food  Insecurity: Not on file  Transportation Needs: Not on file  Physical Activity: Not on file  Stress: Not on file  Social Connections: Not on file  Intimate Partner Violence: Not on file    Past Surgical History:  Procedure Laterality Date   [place] 07[place]   Procedure: LEFTWebster Grovesurgeon: MichSherren Mocha;  [facility];  Service: Cardiovascular;  Laterality: N/A;   TONSILLECTOMY  1966    Family History  Problem Relation Age of Onset   Cancer Mother        ?lung   Diabetes Maternal Grandmother    Heart disease Neg Hx     Allergies  Allergen Reactions   Atorvastatin Other (See Comments)    Muscle aches    Current Outpatient Medications on File Prior to Visit  Medication Sig Dispense Refill   amLODipine (NORVASC) 10 MG tablet TAKE 1 TABLET(10 MG) BY MOUTH DAILY 90 tablet 1   aspirin EC 81 MG tablet Take 1 tablet (81 mg total) by mouth daily.     Blood Glucose Monitoring Suppl (ONETOUCH VERIO) w/Device KIT Test blood sugar once daily. Dx Code:  E11.9 1 kit 0   carvedilol (COREG) 3.125 MG tablet Take 1 tablet (3.125 mg total) by mouth 2 (two) times daily with a meal. 60 tablet 3   cetirizine (ZYRTEC) 10 MG tablet Take 10 mg by mouth daily as needed for allergies.     cloNIDine (CATAPRES) 0.1 MG tablet TAKE 1 TABLET(0.1 MG) BY MOUTH THREE TIMES DAILY 270 tablet 1   glucose blood (ONETOUCH VERIO) test strip Use to check blood sugar once a day.  Dx Code: E11.9 100 each 5   Insulin Pen Needle (B-D UF III MINI PEN NEEDLES) 31G X 5 MM MISC USE WITH INSULIN INJECTION ONCE A DAY 100 each 3   lisinopril-hydrochlorothiazide (ZESTORETIC) 20-25 MG tablet TAKE 1 TABLET BY MOUTH DAILY 90 tablet 1   metFORMIN (GLUCOPHAGE) 1000 MG tablet TAKE 1 TABLET BY MOUTH TWICE DAILY WITH A MEAL 180 tablet 1   ONETOUCH DELICA LANCETS FINE MISC Reported on 05/04/2015  11   pravastatin (PRAVACHOL) 80 MG tablet  Take 1 tablet (80 mg total) by mouth daily. 90 tablet 1   tadalafil (CIALIS) 10 MG tablet Take 1 tablet (10 mg total) by mouth daily as needed for erectile dysfunction. 10 tablet 5   No current facility-administered medications on file prior to visit.    BP 124/60 (BP Location: Right Arm, Patient Position: Sitting, Cuff Size: Normal)   Pulse 73   Resp 17   Ht 5' 8"  (1.727 m)   Wt 188 lb (85.3 kg)   SpO2 98%   BMI 28.59 kg/m        Objective:   Physical Exam Constitutional:      General: He is not in acute distress.    Appearance: He is well-developed.  HENT:     Head: Normocephalic and atraumatic.  Cardiovascular:     Rate and Rhythm: Normal rate and regular rhythm.     Heart sounds: No murmur heard. Pulmonary:     Effort: Pulmonary effort is normal. No respiratory distress.     Breath sounds: Normal breath sounds. No wheezing or rales.  Skin:    General: Skin is warm and dry.  Neurological:     Mental Status: He is alert and oriented to person, place, and time.  Psychiatric:        Behavior: Behavior normal.        Thought Content: Thought content normal.          Assessment & Plan:

## 2020-09-27 NOTE — Assessment & Plan Note (Addendum)
Last A1C was above goal. Discussed importance of DM diet.  Continue metformin, check A1C, Bmet.

## 2020-09-27 NOTE — Patient Instructions (Signed)
Please complete lab work prior to leaving. Let me know if you change your mind and I will get you back in with cardiology for follow up of your heart failure and coronary artery disease.

## 2020-09-27 NOTE — Assessment & Plan Note (Signed)
Tolerating pravastatin 80mg . Obtain follow up lipid panel.

## 2020-09-27 NOTE — Assessment & Plan Note (Signed)
On ASA and Statin, beta blocker. Recommended follow up with cardiology. Pt declines.

## 2020-09-27 NOTE — Assessment & Plan Note (Signed)
I urged him to let me refer him back to cardiology for ongoing management of his cardiomyopathy and moderate CAD.  He declines at this time and tells me that "I will think about it."

## 2020-09-27 NOTE — Assessment & Plan Note (Signed)
BP stable. Continue coreg 3.125 bid, clonidine 0.1 TID, amlodipine 10mg , lisinopril-hctz 20-25mg .

## 2020-09-28 ENCOUNTER — Telehealth: Payer: Self-pay | Admitting: Family

## 2020-09-28 MED ORDER — SITAGLIPTIN PHOSPHATE 100 MG PO TABS: 100.0000 mg | ORAL_TABLET | Freq: Every day | ORAL | 5 refills | Status: DC

## 2020-09-28 NOTE — Telephone Encounter (Signed)
Sugar is uncontrolled.  Please work on [employer] [facility] and add januvia 100mg  once daily.

## 2020-09-29 NOTE — Telephone Encounter (Signed)
Patient advised of results, provider's advise and new prescription. He verbalized understanding

## 2020-11-09 ENCOUNTER — Other Ambulatory Visit: Payer: Self-pay | Admitting: Family

## 2020-12-29 ENCOUNTER — Ambulatory Visit: Payer: BC Managed Care – PPO | Admitting: Family

## 2021-01-25 ENCOUNTER — Other Ambulatory Visit: Payer: Self-pay | Admitting: Family

## 2021-02-26 ENCOUNTER — Other Ambulatory Visit: Payer: Self-pay | Admitting: Family

## 2021-05-31 ENCOUNTER — Other Ambulatory Visit: Payer: Self-pay | Admitting: Family

## 2021-09-10 ENCOUNTER — Other Ambulatory Visit: Payer: Self-pay | Admitting: Family

## 2021-12-11 ENCOUNTER — Telehealth: Payer: Self-pay | Admitting: Family

## 2021-12-11 NOTE — Telephone Encounter (Signed)
Pt advised and scheduled 9/29.

## 2021-12-11 NOTE — Telephone Encounter (Signed)
Pt hasn't been seen in >1 year.  I sent a 7 day supply of his requested medications to his pharmacy- needs OV prior to additional refills.

## 2021-12-21 ENCOUNTER — Ambulatory Visit: Payer: BC Managed Care – PPO | Admitting: Family

## 2021-12-21 VITALS — BP 124/62 | HR 76 | Temp 98.2°F | Resp 16 | Wt 199.0 lb

## 2021-12-21 DIAGNOSIS — I10 Essential (primary) hypertension: Secondary | ICD-10-CM | POA: Diagnosis not present

## 2021-12-21 DIAGNOSIS — I420 Dilated cardiomyopathy: Secondary | ICD-10-CM | POA: Diagnosis not present

## 2021-12-21 DIAGNOSIS — E1121 Type 2 diabetes mellitus with diabetic nephropathy: Secondary | ICD-10-CM

## 2021-12-21 DIAGNOSIS — E785 Hyperlipidemia, unspecified: Secondary | ICD-10-CM | POA: Diagnosis not present

## 2021-12-21 DIAGNOSIS — Z23 Encounter for immunization: Secondary | ICD-10-CM

## 2021-12-21 DIAGNOSIS — I2510 Atherosclerotic heart disease of native coronary artery without angina pectoris: Secondary | ICD-10-CM

## 2021-12-21 LAB — COMPREHENSIVE METABOLIC PANEL
ALT: 23 U/L (ref 0–53)
AST: 14 U/L (ref 0–37)
Albumin: 4.3 g/dL (ref 3.5–5.2)
Alkaline Phosphatase: 136 U/L — ABNORMAL HIGH (ref 39–117)
BUN: 14 mg/dL (ref 6–23)
CO2: 29 mEq/L (ref 19–32)
Calcium: 9.5 mg/dL (ref 8.4–10.5)
Chloride: 95 mEq/L — ABNORMAL LOW (ref 96–112)
Creatinine, Ser: 0.95 mg/dL (ref 0.40–1.50)
GFR: 83.9 mL/min (ref >60.00)
Glucose, Bld: 398 mg/dL — ABNORMAL HIGH (ref 70–99)
Potassium: 4.7 mEq/L (ref 3.5–5.1)
Sodium: 133 mEq/L — ABNORMAL LOW (ref 135–145)
Total Bilirubin: 0.9 mg/dL (ref 0.2–1.2)
Total Protein: 6.9 g/dL (ref 6.0–8.3)

## 2021-12-21 LAB — LIPID PANEL
Cholesterol: 190 mg/dL (ref 0–200)
HDL: 42.4 mg/dL (ref >39.00)
LDL Cholesterol: 118 mg/dL — ABNORMAL HIGH (ref 0–99)
NonHDL: 147.98
Total CHOL/HDL Ratio: 4
Triglycerides: 151 mg/dL — ABNORMAL HIGH (ref 0.0–149.0)
VLDL: 30.2 mg/dL (ref 0.0–40.0)

## 2021-12-21 LAB — MICROALBUMIN / CREATININE URINE RATIO
Creatinine,U: 63 mg/dL
Microalb Creat Ratio: 1.2 mg/g (ref 0.0–30.0)
Microalb, Ur: 0.7 mg/dL (ref 0.0–1.9)

## 2021-12-21 LAB — HEMOGLOBIN A1C: Hgb A1c MFr Bld: 12 % — ABNORMAL HIGH (ref 4.6–6.5)

## 2021-12-21 MED ORDER — AMLODIPINE BESYLATE 10 MG PO TABS: ORAL_TABLET | ORAL | 1 refills | Status: DC

## 2021-12-21 MED ORDER — CLONIDINE HCL 0.1 MG PO TABS: ORAL_TABLET | ORAL | 1 refills | Status: DC

## 2021-12-21 MED ORDER — METFORMIN HCL 1000 MG PO TABS
1000.0000 mg | ORAL_TABLET | Freq: Two times a day (BID) | ORAL | 0 refills | Status: DC
Start: 2021-12-21 — End: 2022-07-03

## 2021-12-21 MED ORDER — LISINOPRIL-HYDROCHLOROTHIAZIDE 20-25 MG PO TABS: 1.0000 | ORAL_TABLET | Freq: Every day | ORAL | 1 refills | Status: DC

## 2021-12-21 MED ORDER — PRAVASTATIN SODIUM 80 MG PO TABS: 80.0000 mg | ORAL_TABLET | Freq: Every day | ORAL | 1 refills | Status: DC

## 2021-12-21 MED ORDER — SITAGLIPTIN PHOSPHATE 100 MG PO TABS: 100.0000 mg | ORAL_TABLET | Freq: Every day | ORAL | 5 refills | Status: DC

## 2021-12-21 MED ORDER — CARVEDILOL 3.125 MG PO TABS: 3.1250 mg | ORAL_TABLET | Freq: Two times a day (BID) | ORAL | 3 refills | Status: DC

## 2021-12-21 NOTE — Assessment & Plan Note (Addendum)
He states he has seen cardiology in the past and "they told me the same thing you did so I didn't see the point in paying for 2 doctors."  Advised pt that cardiology is focusing on different disease states and is also important. Will check a 2D echo. Pt appears euvolemic at this time.

## 2021-12-21 NOTE — Assessment & Plan Note (Signed)
Previous cardiac cath back in 2016 noted Mild - moderate diffuse nonobstructive CAD. Aggressive risk reduction was recommended at that time.  Unfortunately, he has not been compliant with medical recommendations.

## 2021-12-21 NOTE — Assessment & Plan Note (Addendum)
Lab Results  Component Value Date   HGBA1C 7.6 (H) 09/27/2020   HGBA1C 7.5 (H) 06/28/2020   HGBA1C 7.1 (H) 03/29/2020   Lab Results  Component Value Date   MICROALBUR 32.2 (H) 08/11/2015   LDLCALC 88 09/27/2020   CREATININE 1.10 09/27/2020   Poor compliance with meds and diet. States he never started [place].  Ran out of metformin 5 months ago and did not renew it. Will check A1C.  Expect it to be elevated.  Reinforced importance of compliance.

## 2021-12-21 NOTE — Assessment & Plan Note (Signed)
Maintained on pravastatin 80mg  once daily. Check lipid panel.

## 2021-12-21 NOTE — Assessment & Plan Note (Signed)
BP Readings from Last 3 Encounters:  12/21/21 124/62  09/27/20 124/60  06/28/20 (!) 102/59   BP looks good. Continue clonidine, zestoretic, carvedilol, amlodipine.

## 2021-12-21 NOTE — Patient Instructions (Signed)
Please schedule a diabetic eye exam.

## 2021-12-21 NOTE — Progress Notes (Addendum)
Subjective:   By signing my name below, I, Paul Yu, attest that this documentation has been prepared under the direction and in the presence of Paul Chimera, NP 12/21/2021    Patient ID: Paul Yu, male    DOB: December 02, 1955, 66 y.o.   MRN: [id number]  Chief Complaint  Patient presents with   Hypertension    Here for follow up, "ran out of Metformin about 5 months ago"    HPI Patient is in today for an office visit. He has not been seen in >1 year.   Blood Pressure: states that he is continuing to take his blood pressure medications (though refill history does not support this). His blood pressure is normal.  BP Readings from Last 3 Encounters:  12/21/21 124/62  09/27/20 124/60  06/28/20 (!) 102/59   Pulse Readings from Last 3 Encounters:  12/21/21 76  09/27/20 73  06/28/20 69   Blood Sugars: He is no longer taking his 1000 Mg of Metformin. He states that he ran out of the medication. He is also not taking his 100 Mg of Januvia. Eating a lot of sweets/  Lab Results  Component Value Date   HGBA1C 7.6 (H) 09/27/2020   Breathing: He reports his breathing is controlled. He notes of some congestion and cold last week.   Cardiology: He is not regularly following up with a cardiologist.    Podiatrist: He is not interested in being referred to a podiatrist at this moment.   Immunizations: He is interested in receiving an pneumonia vaccine during today's visit. He is not interested in receiving an influenza vaccine at the moment   Vision: He is not UTD on his vision exams.   Colonoscopy: His last Cologuard was completed in 2021 Health Maintenance Due  Topic Date Due   Diabetic kidney evaluation - Urine ACR  08/10/2016   OPHTHALMOLOGY EXAM  11/13/2019   HEMOGLOBIN A1C  03/30/2021   Diabetic kidney evaluation - GFR measurement  09/27/2021   INFLUENZA VACCINE  10/23/2021    Past Medical History:  Diagnosis Date   Diabetes mellitus without complication [facility])      Past Surgical History:  Procedure Laterality Date   LEFT HEART CATHETERIZATION WITH CORONARY ANGIOGRAM N/A 07/15/2014   Procedure: LEFT HEART CATHETERIZATION WITH CORONARY ANGIOGRAM;  Surgeon: Sherren Mocha, MD;  Location: [facility] CATH LAB;  Service: Cardiovascular;  Laterality: N/A;   TONSILLECTOMY  1966    Family History  Problem Relation Age of Onset   Cancer Mother        ?lung   Diabetes Maternal Grandmother    Heart disease Neg Hx     Social History   Socioeconomic History   Marital status: Married    Spouse name: Not on file   Number of children: Not on file   Years of education: Not on file   Highest education level: Not on file  Occupational History   Not on file  Tobacco Use   Smoking status: Never   Smokeless tobacco: Never  Substance and Sexual Activity   Alcohol use: Yes    Alcohol/week: 0.0 standard drinks of alcohol    Comment: rarely,    Drug use: No   Sexual activity: Not on file  Other Topics Concern   Not on file  Social History Narrative   4th marriage-    Daughter born 2002   Son 1982-lives in [place]- in [place] a truck   Completed 9th  grade- GED   Enjoys working on cars,     Scientist, physiological Strain: Not on file  Food Insecurity: Not on file  Transportation Needs: Not on file  Physical Activity: Not on file  Stress: Not on file  Social Connections: Not on file  Intimate Partner Violence: Not on file    Outpatient Medications Prior to Visit  Medication Sig Dispense Refill   aspirin EC 81 MG tablet Take 1 tablet (81 mg total) by mouth daily.     Blood Glucose Monitoring Suppl (ONETOUCH VERIO) w/Device KIT Test blood sugar once daily. Dx Code: E11.9 1 kit 0   cetirizine (ZYRTEC) 10 MG tablet Take 10 mg by mouth daily as needed for allergies.     glucose blood (ONETOUCH VERIO) test strip Use to check blood sugar once a day.  Dx Code: E11.9 100 each 5   Insulin Pen Needle (B-D  UF III MINI PEN NEEDLES) 31G X 5 MM MISC USE WITH INSULIN INJECTION ONCE A DAY 100 each 3   ONETOUCH DELICA LANCETS FINE MISC Reported on 05/04/2015  11   tadalafil (CIALIS) 10 MG tablet Take 1 tablet (10 mg total) by mouth daily as needed for erectile dysfunction. 10 tablet 5   amLODipine (NORVASC) 10 MG tablet TAKE 1 TABLET(10 MG) BY MOUTH DAILY 7 tablet 0   carvedilol (COREG) 3.125 MG tablet Take 1 tablet (3.125 mg total) by mouth 2 (two) times daily with a meal. 60 tablet 3   cloNIDine (CATAPRES) 0.1 MG tablet TAKE 1 TABLET(0.1 MG) BY MOUTH THREE TIMES DAILY 90 tablet 1   lisinopril-hydrochlorothiazide (ZESTORETIC) 20-25 MG tablet TAKE 1 TABLET BY MOUTH DAILY 7 tablet 0   pravastatin (PRAVACHOL) 80 MG tablet Take 1 tablet (80 mg total) by mouth daily. 90 tablet 1   sitaGLIPtin (JANUVIA) 100 MG tablet Take 1 tablet (100 mg total) by mouth daily. 30 tablet 5   metFORMIN (GLUCOPHAGE) 1000 MG tablet TAKE 1 TABLET BY MOUTH TWICE DAILY WITH A MEAL (Patient not taking: Reported on 12/21/2021) 180 tablet 0   No facility-administered medications prior to visit.    Allergies  Allergen Reactions   Atorvastatin Other (See Comments)    Muscle aches    ROS See HPI    Objective:    Physical Exam Constitutional:      General: He is not in acute distress.    Appearance: Normal appearance. He is not ill-appearing.  HENT:     Head: Normocephalic and atraumatic.     Right Ear: External ear normal.     Left Ear: External ear normal.  Eyes:     Extraocular Movements: Extraocular movements intact.     Pupils: Pupils are equal, round, and reactive to light.  Cardiovascular:     Rate and Rhythm: Normal rate and regular rhythm.     Pulses:          Dorsalis pedis pulses are 2+ on the right side and 2+ on the left side.       Posterior tibial pulses are 2+ on the right side and 2+ on the left side.     Heart sounds: Normal heart sounds. No murmur heard.    No gallop.  Pulmonary:     Effort:  Pulmonary effort is normal. No respiratory distress.     Breath sounds: Normal breath sounds. No wheezing or rales.  Skin:    General: Skin is warm and dry.  Neurological:  Mental Status: He is alert and oriented to person, place, and time.  Psychiatric:        Mood and Affect: Mood normal.        Behavior: Behavior normal.        Judgment: Judgment normal.     BP 124/62   Pulse 76   Temp 98.2 F (36.8 C) (Oral)   Resp 16   Wt 199 lb (90.3 kg)   SpO2 98%   BMI 30.26 kg/m  Wt Readings from Last 3 Encounters:  12/21/21 199 lb (90.3 kg)  09/27/20 188 lb (85.3 kg)  06/28/20 190 lb 3.2 oz (86.3 kg)   Diabetic Foot Exam - Simple   Simple Foot Form Diabetic Foot exam was performed with the following findings: Yes 12/21/2021 11:21 AM  Visual Inspection No deformities, no ulcerations, no other skin breakdown bilaterally: Yes Sensation Testing Intact to touch and monofilament testing bilaterally: Yes Pulse Check Posterior Tibialis and Dorsalis pulse intact bilaterally: Yes Comments Hypertrophic toenails bilaterally        Assessment & Plan:   Problem List Items Addressed This Visit       Unprioritized   Hyperlipidemia    Maintained on pravastatin 62m once daily. Check lipid panel.       Relevant Medications   pravastatin (PRAVACHOL) 80 MG tablet   lisinopril-hydrochlorothiazide (ZESTORETIC) 20-25 MG tablet   cloNIDine (CATAPRES) 0.1 MG tablet   carvedilol (COREG) 3.125 MG tablet   amLODipine (NORVASC) 10 MG tablet   Other Relevant Orders   Lipid panel   HTN (hypertension)    BP Readings from Last 3 Encounters:  12/21/21 124/62  09/27/20 124/60  06/28/20 (!) 102/59  BP looks good. Continue clonidine, zestoretic, carvedilol, amlodipine.      Relevant Medications   pravastatin (PRAVACHOL) 80 MG tablet   lisinopril-hydrochlorothiazide (ZESTORETIC) 20-25 MG tablet   cloNIDine (CATAPRES) 0.1 MG tablet   carvedilol (COREG) 3.125 MG tablet   amLODipine  (NORVASC) 10 MG tablet   DM2 (diabetes mellitus, type 2) (HCC) - Primary    Lab Results  Component Value Date   HGBA1C 7.6 (H) 09/27/2020   HGBA1C 7.5 (H) 06/28/2020   HGBA1C 7.1 (H) 03/29/2020   Lab Results  Component Value Date   MICROALBUR 32.2 (H) 08/11/2015   LDLCALC 88 09/27/2020   CREATININE 1.10 09/27/2020  Poor compliance with meds and diet. States he never started [place]  Ran out of metformin 5 months ago and did not renew it. Will check A1C.  Expect it to be elevated.  Reinforced importance of compliance.        Relevant Medications   sitaGLIPtin (JANUVIA) 100 MG tablet   pravastatin (PRAVACHOL) 80 MG tablet   metFORMIN (GLUCOPHAGE) 1000 MG tablet   lisinopril-hydrochlorothiazide (ZESTORETIC) 20-25 MG tablet   Other Relevant Orders   Hemoglobin A1c   Urine Microalbumin w/creat. ratio   Comp Met (CMET)   DCM (dilated cardiomyopathy) (HAspers    He states he has seen cardiology in the past and "they told me the same thing you did so I didn't see the point in paying for 2 doctors."  Advised pt that cardiology is focusing on different disease states and is also important. Will check a 2D echo. Pt appears euvolemic at this time.       Relevant Medications   pravastatin (PRAVACHOL) 80 MG tablet   lisinopril-hydrochlorothiazide (ZESTORETIC) 20-25 MG tablet   cloNIDine (CATAPRES) 0.1 MG tablet   carvedilol (COREG) 3.125 MG tablet   amLODipine (  NORVASC) 10 MG tablet   Other Relevant Orders   Ambulatory referral to Cardiology   Coronary artery disease    Previous cardiac cath back in 2016 noted Mild - moderate diffuse nonobstructive CAD. Aggressive risk reduction was recommended at that time.  Unfortunately, he has not been compliant with medical recommendations.       Relevant Medications   pravastatin (PRAVACHOL) 80 MG tablet   lisinopril-hydrochlorothiazide (ZESTORETIC) 20-25 MG tablet   cloNIDine (CATAPRES) 0.1 MG tablet   carvedilol (COREG) 3.125 MG tablet    amLODipine (NORVASC) 10 MG tablet   Other Relevant Orders   Ambulatory referral to Cardiology   Congestive dilated cardiomyopathy (HCC)   Relevant Medications   pravastatin (PRAVACHOL) 80 MG tablet   lisinopril-hydrochlorothiazide (ZESTORETIC) 20-25 MG tablet   cloNIDine (CATAPRES) 0.1 MG tablet   carvedilol (COREG) 3.125 MG tablet   amLODipine (NORVASC) 10 MG tablet   Other Relevant Orders   ECHOCARDIOGRAM COMPLETE   Other Visit Diagnoses     Need for pneumococcal 20-valent conjugate vaccination       Relevant Orders   Pneumococcal conjugate vaccine 20-valent (Prevnar 20) (Completed)      Meds ordered this encounter  Medications   sitaGLIPtin (JANUVIA) 100 MG tablet    Sig: Take 1 tablet (100 mg total) by mouth daily.    Dispense:  30 tablet    Refill:  5    Order Specific Question:   Supervising Provider    Answer:   Penni Homans A [[id number]]   pravastatin (PRAVACHOL) 80 MG tablet    Sig: Take 1 tablet (80 mg total) by mouth daily.    Dispense:  90 tablet    Refill:  1    Order Specific Question:   Supervising Provider    Answer:   Penni Homans A [[id number]]   metFORMIN (GLUCOPHAGE) 1000 MG tablet    Sig: Take 1 tablet (1,000 mg total) by mouth 2 (two) times daily with a meal.    Dispense:  180 tablet    Refill:  0    Order Specific Question:   Supervising Provider    Answer:   Penni Homans A [[id number]]   lisinopril-hydrochlorothiazide (ZESTORETIC) 20-25 MG tablet    Sig: Take 1 tablet by mouth daily.    Dispense:  90 tablet    Refill:  1    Order Specific Question:   Supervising Provider    Answer:   Penni Homans A [[id number]]   cloNIDine (CATAPRES) 0.1 MG tablet    Sig: TAKE 1 TABLET(0.1 MG) BY MOUTH THREE TIMES DAILY    Dispense:  90 tablet    Refill:  1    Order Specific Question:   Supervising Provider    Answer:   Penni Homans A [[id number]]   carvedilol (COREG) 3.125 MG tablet    Sig: Take 1 tablet (3.125 mg total) by mouth 2 (two) times daily with a meal.    Dispense:   60 tablet    Refill:  3    Order Specific Question:   Supervising Provider    Answer:   Penni Homans A [[id number]]   amLODipine (NORVASC) 10 MG tablet    Sig: TAKE 1 TABLET(10 MG) BY MOUTH DAILY    Dispense:  90 tablet    Refill:  1    Order Specific Question:   Supervising Provider    Answer:   Penni Homans A [[id number]]    I, Nance Pear, NP, personally preformed the services described in this  documentation.  All medical record entries made by the scribe were at my direction and in my presence.  I have reviewed the chart and discharge instructions (if applicable) and agree that the record reflects my personal performance and is accurate and complete. 12/21/2021   I,Amber Collins,acting as a scribe for Nance Pear, NP.,have documented all relevant documentation on the behalf of Nance Pear, NP,as directed by  Nance Pear, NP while in the presence of Nance Pear, NP.   Nance Pear, NP

## 2021-12-24 ENCOUNTER — Telehealth: Payer: Self-pay | Admitting: Family

## 2021-12-24 NOTE — Telephone Encounter (Signed)
Please advise pt that his diabetes is very uncontrolled. A1C has risen from 7.6 to 12.  Goal is <7.  He should restart both metformin and Januvia as well as work on healthy diet/exercise/weight loss.  I would like to see him back in 3 months.   In addition to checking his echo as we discussed, I would also like for him to see cardiology.  Previous cardiac catheterization that he had showed some build up in his coronary arteries.    Cholesterol above goal. Please be sure to take pravastatin daily.

## 2021-12-25 NOTE — Telephone Encounter (Signed)
Pt states that he has no desire to see a cardiologist, he has seen one in the past and felt like it was a waste of money. He will pick up the Januvia as soon as the pharmacy has it ready.

## 2022-01-11 ENCOUNTER — Ambulatory Visit (HOSPITAL_BASED_OUTPATIENT_CLINIC_OR_DEPARTMENT_OTHER)
Admission: RE | Admit: 2022-01-11 | Discharge: 2022-01-11 | Disposition: A | Discharge status: Home / Self Care | Payer: BC Managed Care – PPO | Source: Ambulatory Visit | Attending: Family | Admitting: Family

## 2022-01-11 DIAGNOSIS — I420 Dilated cardiomyopathy: Secondary | ICD-10-CM | POA: Insufficient documentation

## 2022-01-11 DIAGNOSIS — I509 Heart failure, unspecified: Secondary | ICD-10-CM | POA: Diagnosis not present

## 2022-01-11 NOTE — Progress Notes (Incomplete)
  Echocardiogram 2D Echocardiogram has been performed.  Paul Yu 01/11/2022, 8:48 AM

## 2022-01-13 LAB — ECHOCARDIOGRAM COMPLETE
AR max vel: 2.32 cm2
AV Area VTI: 2.65 cm2
AV Area mean vel: 2.56 cm2
AV Mean grad: 5 mmHg
AV Peak grad: 10.8 mmHg
Ao pk vel: 1.64 m/s
Area-P 1/2: 3.11 cm2
Calc EF: 52.3 %
MV M vel: 5.23 m/s
MV Peak grad: 109.6 mmHg
P 1/2 time: 467 msec
S' Lateral: 3.7 cm
Single Plane A2C EF: 51.7 %
Single Plane A4C EF: 55.8 %

## 2022-03-18 ENCOUNTER — Other Ambulatory Visit: Payer: Self-pay | Admitting: Family

## 2022-03-29 ENCOUNTER — Ambulatory Visit: Payer: BC Managed Care – PPO | Admitting: Family

## 2022-03-29 VITALS — BP 139/69 | HR 74 | Temp 97.9°F | Resp 16 | Ht 68.0 in | Wt 199.0 lb

## 2022-03-29 DIAGNOSIS — I2510 Atherosclerotic heart disease of native coronary artery without angina pectoris: Secondary | ICD-10-CM

## 2022-03-29 DIAGNOSIS — E785 Hyperlipidemia, unspecified: Secondary | ICD-10-CM | POA: Diagnosis not present

## 2022-03-29 DIAGNOSIS — E1121 Type 2 diabetes mellitus with diabetic nephropathy: Secondary | ICD-10-CM | POA: Diagnosis not present

## 2022-03-29 DIAGNOSIS — I10 Essential (primary) hypertension: Secondary | ICD-10-CM | POA: Diagnosis not present

## 2022-03-29 LAB — COMPREHENSIVE METABOLIC PANEL
ALT: 26 U/L (ref 0–53)
AST: 19 U/L (ref 0–37)
Albumin: 4.3 g/dL (ref 3.5–5.2)
Alkaline Phosphatase: 128 U/L — ABNORMAL HIGH (ref 39–117)
BUN: 14 mg/dL (ref 6–23)
CO2: 28 mEq/L (ref 19–32)
Calcium: 9.4 mg/dL (ref 8.4–10.5)
Chloride: 97 mEq/L (ref 96–112)
Creatinine, Ser: 0.81 mg/dL (ref 0.40–1.50)
GFR: 92.25 mL/min (ref >60.00)
Glucose, Bld: 302 mg/dL — ABNORMAL HIGH (ref 70–99)
Potassium: 4.1 mEq/L (ref 3.5–5.1)
Sodium: 136 mEq/L (ref 135–145)
Total Bilirubin: 0.7 mg/dL (ref 0.2–1.2)
Total Protein: 6.6 g/dL (ref 6.0–8.3)

## 2022-03-29 LAB — HEMOGLOBIN A1C: Hgb A1c MFr Bld: 11.1 % — ABNORMAL HIGH (ref 4.6–6.5)

## 2022-03-29 NOTE — Assessment & Plan Note (Addendum)
Encouraged him to follow back up with cardiology.  Discussed being proactive before he has potential blockage.  He declines.  "My days are numbered anyhow."  Continue med management with statin, aspirin, beta blocker.

## 2022-03-29 NOTE — Assessment & Plan Note (Signed)
Uncontrolled.  Will repeat A1C.  He is interested in Trulicity or Ozempic if needed.  Can't afford [place]. Advised pt to increase metformin to bid. Discussed dietary changes- he is not motivated to change his diet at this time.

## 2022-03-29 NOTE — Assessment & Plan Note (Signed)
Lab Results  Component Value Date   CHOL 190 12/21/2021   HDL 42.40 12/21/2021   LDLCALC 118 (H) 12/21/2021   LDLDIRECT 131.0 12/23/2014   TRIG 151.0 (H) 12/21/2021   CHOLHDL 4 12/21/2021   Fair lipid control. Continue pravastatin.

## 2022-03-29 NOTE — Assessment & Plan Note (Signed)
Bp stable. Continue current meds.

## 2022-03-29 NOTE — Progress Notes (Signed)
Subjective:     Patient ID: Paul Yu, male    DOB: 15-Jun-1955, 67 y.o.   MRN: [id number]  Chief Complaint  Patient presents with   Diabetes    Here for follow up    Diabetes   Patient is in today for follow up.  DM2- did not take [place] because it was too expensive and his copay ran out.  Only taking metformin once daily- thought this is how he is supposed to take it.  Eating a lot of sweets.   ED- not sexually active recently, therefore not using cialis.   Hyperlipidemia- reports good compliance with zestoretic.  HTN-  BP Readings from Last 3 Encounters:  03/29/22 139/69  12/21/21 124/62  09/27/20 124/60   Maintained on zestoretic, clonidine, carvedilol, amlodipine.   Health Maintenance Due  Topic Date Due   OPHTHALMOLOGY EXAM  11/13/2019    Past Medical History:  Diagnosis Date   Diabetes mellitus without complication [facility])     Past Surgical History:  Procedure Laterality Date   LEFT HEART CATHETERIZATION WITH CORONARY ANGIOGRAM N/A 07/15/2014   Procedure: LEFT HEART CATHETERIZATION WITH CORONARY ANGIOGRAM;  Surgeon: Tonny Bollman, MD;  Location: [facility] CATH LAB;  Service: Cardiovascular;  Laterality: N/A;   TONSILLECTOMY  1966    Family History  Problem Relation Age of Onset   Cancer Mother        ?lung   Diabetes Maternal Grandmother    Heart disease Neg Hx     Social History   Socioeconomic History   Marital status: Married    Spouse name: Not on file   Number of children: Not on file   Years of education: Not on file   Highest education level: Not on file  Occupational History   Not on file  Tobacco Use   Smoking status: Never   Smokeless tobacco: Never  Substance and Sexual Activity   Alcohol use: Yes    Alcohol/week: 0.0 standard drinks of alcohol    Comment: rarely,    Drug use: No   Sexual activity: Not on file  Other Topics Concern   Not on file  Social History Narrative   4th marriage-    Daughter born 2002   Son  1982-lives in [place]   Son 1985- in [place]   Drives a truck   Completed 9th grade- GED   Enjoys working on cars,     Social Determinants of Corporate investment banker Strain: Not on Ship broker Insecurity: Not on file  Transportation Needs: Not on file  Physical Activity: Not on file  Stress: Not on file  Social Connections: Not on file  Intimate Partner Violence: Not on file    Outpatient Medications Prior to Visit  Medication Sig Dispense Refill   amLODipine (NORVASC) 10 MG tablet TAKE 1 TABLET(10 MG) BY MOUTH DAILY 90 tablet 1   aspirin EC 81 MG tablet Take 1 tablet (81 mg total) by mouth daily.     Blood Glucose Monitoring Suppl (ONETOUCH VERIO) w/Device KIT Test blood sugar once daily. Dx Code: E11.9 1 kit 0   carvedilol (COREG) 3.125 MG tablet TAKE 1 TABLET(3.125 MG) BY MOUTH TWICE DAILY WITH A MEAL 60 tablet 3   cetirizine (ZYRTEC) 10 MG tablet Take 10 mg by mouth daily as needed for allergies.     cloNIDine (CATAPRES) 0.1 MG tablet TAKE 1 TABLET(0.1 MG) BY MOUTH THREE TIMES DAILY 270 tablet 0   glucose blood (ONETOUCH VERIO) test strip Use  to check blood sugar once a day.  Dx Code: E11.9 100 each 5   Insulin Pen Needle (B-D UF III MINI PEN NEEDLES) 31G X 5 MM MISC USE WITH INSULIN INJECTION ONCE A DAY 100 each 3   lisinopril-hydrochlorothiazide (ZESTORETIC) 20-25 MG tablet Take 1 tablet by mouth daily. 90 tablet 1   metFORMIN (GLUCOPHAGE) 1000 MG tablet Take 1 tablet (1,000 mg total) by mouth 2 (two) times daily with a meal. 180 tablet 0   ONETOUCH DELICA LANCETS FINE MISC Reported on 05/04/2015  11   pravastatin (PRAVACHOL) 80 MG tablet Take 1 tablet (80 mg total) by mouth daily. 90 tablet 1   tadalafil (CIALIS) 10 MG tablet Take 1 tablet (10 mg total) by mouth daily as needed for erectile dysfunction. 10 tablet 5   sitaGLIPtin (JANUVIA) 100 MG tablet Take 1 tablet (100 mg total) by mouth daily. 30 tablet 5   No facility-administered medications prior to visit.     Allergies  Allergen Reactions   Atorvastatin Other (See Comments)    Muscle aches    ROS See HPI    Objective:    Physical Exam Constitutional:      General: He is not in acute distress.    Appearance: He is well-developed.  HENT:     Head: Normocephalic and atraumatic.  Cardiovascular:     Rate and Rhythm: Normal rate and regular rhythm.     Heart sounds: No murmur heard. Pulmonary:     Effort: Pulmonary effort is normal. No respiratory distress.     Breath sounds: Normal breath sounds. No wheezing or rales.  Skin:    General: Skin is warm and dry.  Neurological:     Mental Status: He is alert and oriented to person, place, and time.  Psychiatric:        Behavior: Behavior normal.        Thought Content: Thought content normal.     BP 139/69   Pulse 74   Temp 97.9 F (36.6 C) (Oral)   Resp 16   Ht 5\' 8"  (1.727 m)   Wt 199 lb (90.3 kg)   SpO2 98%   BMI 30.26 kg/m  Wt Readings from Last 3 Encounters:  03/29/22 199 lb (90.3 kg)  12/21/21 199 lb (90.3 kg)  09/27/20 188 lb (85.3 kg)       Assessment & Plan:   Problem List Items Addressed This Visit       Unprioritized   Hyperlipidemia    Lab Results  Component Value Date   CHOL 190 12/21/2021   HDL 42.40 12/21/2021   LDLCALC 118 (H) 12/21/2021   LDLDIRECT 131.0 12/23/2014   TRIG 151.0 (H) 12/21/2021   CHOLHDL 4 12/21/2021  Fair lipid control. Continue pravastatin.       HTN (hypertension)    Bp stable. Continue current meds.       DM2 (diabetes mellitus, type 2) ([place]) - Primary    Uncontrolled.  Will repeat A1C.  He is interested in Trulicity or Ozempic if needed.  Can't afford [place]. Advised pt to increase metformin to bid. Discussed dietary changes- he is not motivated to change his diet at this time.       Relevant Orders   Ambulatory referral to Optometry   Comp Met (CMET)   HgB A1c   Coronary artery disease    Encouraged him to follow back up with cardiology.  Discussed being  proactive before he has potential blockage.  He declines.  "My  days are numbered anyhow."  Continue med management with statin, aspirin, beta blocker.        I have discontinued Marlon Pel. Glidden's sitaGLIPtin. I am also having him maintain his aspirin EC, OneTouch Delica Lancets Fine, cetirizine, OneTouch Verio, OneTouch Verio, B-D UF III MINI PEN NEEDLES, tadalafil, pravastatin, metFORMIN, lisinopril-hydrochlorothiazide, amLODipine, cloNIDine, and carvedilol.  No orders of the defined types were placed in this encounter.

## 2022-04-02 ENCOUNTER — Telehealth: Payer: Self-pay | Admitting: Family

## 2022-04-02 MED ORDER — TRULICITY 0.75 MG/0.5ML ~~LOC~~ SOAJ: 0.7500 mg | SUBCUTANEOUS | 2 refills | Status: DC

## 2022-04-02 NOTE — Telephone Encounter (Signed)
Please advise pt that his A1C is slightly improved at 11.1 down from 12.0, but still very high and above goal of <7.  I would like him to increase his metformin to twice daily and add trulicity injection once weekly- assuming we can get it approved by his insurance.

## 2022-04-02 NOTE — Telephone Encounter (Signed)
Lvm for patient to call back about his results 

## 2022-04-03 NOTE — Telephone Encounter (Signed)
Patient notified of results, new dose of metformin and new rx. He verbalized understanding

## 2022-04-08 ENCOUNTER — Other Ambulatory Visit: Payer: Self-pay | Admitting: Family

## 2022-04-08 DIAGNOSIS — Z1211 Encounter for screening for malignant neoplasm of colon: Secondary | ICD-10-CM

## 2022-04-24 LAB — HM DIABETES EYE EXAM

## 2022-04-26 DIAGNOSIS — Z1211 Encounter for screening for malignant neoplasm of colon: Secondary | ICD-10-CM | POA: Diagnosis not present

## 2022-05-04 LAB — COLOGUARD: COLOGUARD: NEGATIVE

## 2022-06-16 ENCOUNTER — Other Ambulatory Visit: Payer: Self-pay | Admitting: Family

## 2022-07-01 ENCOUNTER — Telehealth: Payer: Self-pay

## 2022-07-01 NOTE — Telephone Encounter (Signed)
PA initiated via Covermymeds; KEY: [id number]. PA approved.   This request has been approved using information available on the patient's profile. CaseId:[id number];Status:Approved;Review Type:Prior Auth;Coverage Start Date:06/01/2022;Coverage End Date:07/01/2023;

## 2022-07-03 ENCOUNTER — Ambulatory Visit: Payer: BC Managed Care – PPO | Admitting: Family

## 2022-07-03 VITALS — BP 136/72 | HR 62 | Temp 98.1°F | Resp 16 | Wt 205.0 lb

## 2022-07-03 DIAGNOSIS — E1121 Type 2 diabetes mellitus with diabetic nephropathy: Secondary | ICD-10-CM

## 2022-07-03 DIAGNOSIS — E785 Hyperlipidemia, unspecified: Secondary | ICD-10-CM

## 2022-07-03 DIAGNOSIS — I10 Essential (primary) hypertension: Secondary | ICD-10-CM

## 2022-07-03 LAB — COMPREHENSIVE METABOLIC PANEL
ALT: 22 U/L (ref 0–53)
AST: 17 U/L (ref 0–37)
Albumin: 4.4 g/dL (ref 3.5–5.2)
Alkaline Phosphatase: 113 U/L (ref 39–117)
BUN: 16 mg/dL (ref 6–23)
CO2: 29 mEq/L (ref 19–32)
Calcium: 9.6 mg/dL (ref 8.4–10.5)
Chloride: 97 mEq/L (ref 96–112)
Creatinine, Ser: 0.84 mg/dL (ref 0.40–1.50)
GFR: 91.07 mL/min (ref >60.00)
Glucose, Bld: 214 mg/dL — ABNORMAL HIGH (ref 70–99)
Potassium: 4.6 mEq/L (ref 3.5–5.1)
Sodium: 136 mEq/L (ref 135–145)
Total Bilirubin: 0.8 mg/dL (ref 0.2–1.2)
Total Protein: 6.6 g/dL (ref 6.0–8.3)

## 2022-07-03 LAB — LIPID PANEL
Cholesterol: 113 mg/dL (ref 0–200)
HDL: 41.8 mg/dL (ref >39.00)
LDL Cholesterol: 52 mg/dL (ref 0–99)
NonHDL: 71.27
Total CHOL/HDL Ratio: 3
Triglycerides: 97 mg/dL (ref 0.0–149.0)
VLDL: 19.4 mg/dL (ref 0.0–40.0)

## 2022-07-03 LAB — HEMOGLOBIN A1C: Hgb A1c MFr Bld: 11.1 % — ABNORMAL HIGH (ref 4.6–6.5)

## 2022-07-03 MED ORDER — TRULICITY 0.75 MG/0.5ML ~~LOC~~ SOAJ: 0.7500 mg | SUBCUTANEOUS | 3 refills | Status: DC

## 2022-07-03 MED ORDER — METFORMIN HCL 1000 MG PO TABS: 1000.0000 mg | ORAL_TABLET | Freq: Two times a day (BID) | ORAL | 1 refills | Status: DC

## 2022-07-03 NOTE — Assessment & Plan Note (Addendum)
Lab Results  Component Value Date   HGBA1C 11.1 (H) 03/29/2022   HGBA1C 12.0 (H) 12/21/2021   HGBA1C 7.6 (H) 09/27/2020   Lab Results  Component Value Date   MICROALBUR 0.7 12/21/2021   LDLCALC 118 (H) 12/21/2021   CREATININE 0.81 03/29/2022   He has had trulicity intermittently due to drug availability. Ran out of metformin and got confused so thought that we wanted him to stop it since he had no refills at his pharmacy. Restart metformin, continue trulicity.

## 2022-07-03 NOTE — Assessment & Plan Note (Signed)
Lab Results  Component Value Date   CHOL 190 12/21/2021   HDL 42.40 12/21/2021   LDLCALC 118 (H) 12/21/2021   LDLDIRECT 131.0 12/23/2014   TRIG 151.0 (H) 12/21/2021   CHOLHDL 4 12/21/2021   LDL was slightly above goal last visit on pravastatin 80mg . Will continue same.

## 2022-07-03 NOTE — Assessment & Plan Note (Signed)
At goal, continue amlodipine, carvedilol, catapres, zestoretic.  BP Readings from Last 3 Encounters:  07/03/22 136/72  03/29/22 139/69  12/21/21 124/62

## 2022-07-03 NOTE — Progress Notes (Signed)
Subjective:   By signing my name below, I, Barrett Shell, attest that this documentation has been prepared under the direction and in the presence of Sandford Craze, NP. 07/03/2022   Patient ID: Paul Yu, male    DOB: Dec 03, 1955, 67 y.o.   MRN: [id number]  Chief Complaint  Patient presents with   Hypertension    Here for follow up   Diabetes    Here for follow up    HPI Patient is in today for a follow-up appointment.   Blood pressure: His blood pressure is stable during this visit. He is taking 10 mg amlodipine daily PO, 3.125 mg carvedilol 2x daily PO, 0.1 mg clonidine daily PO, and 20-25 mg lisinopril-hydrochlorothiazide daily PO to manage it.  BP Readings from Last 3 Encounters:  07/03/22 136/72  03/29/22 139/69  12/21/21 124/62    Pulse Readings from Last 3 Encounters:  07/03/22 62  03/29/22 74  12/21/21 76     A1C: His A1C levels are elevated. He takes 0.75 mg Trulicity injections when he is able to obtain them. He takes 1000 mg metformin 2x daily PO to manage it.  Lab Results  Component Value Date   HGBA1C 11.1 (H) 03/29/2022    Cholesterol: His cholesterol levels were slightly elevated during his last blood work. He is taking 80 mg pravastatin daily PO to manage it.  Lab Results  Component Value Date   CHOL 190 12/21/2021   HDL 42.40 12/21/2021   LDLCALC 118 (H) 12/21/2021   LDLDIRECT 131.0 12/23/2014   TRIG 151.0 (H) 12/21/2021   CHOLHDL 4 12/21/2021    Lab Results  Component Value Date   HGBA1C 11.1 (H) 03/29/2022      Past Medical History:  Diagnosis Date   Diabetes mellitus without complication [facility])     Past Surgical History:  Procedure Laterality Date   LEFT HEART CATHETERIZATION WITH CORONARY ANGIOGRAM N/A 07/15/2014   Procedure: LEFT HEART CATHETERIZATION WITH CORONARY ANGIOGRAM;  Surgeon: Tonny Bollman, MD;  Location: [facility] CATH LAB;  Service: Cardiovascular;  Laterality: N/A;   TONSILLECTOMY  1966    Family History  Problem  Relation Age of Onset   Cancer Mother        ?lung   Diabetes Maternal Grandmother    Heart disease Neg Hx     Social History   Socioeconomic History   Marital status: Married    Spouse name: Not on file   Number of children: Not on file   Years of education: Not on file   Highest education level: Not on file  Occupational History   Not on file  Tobacco Use   Smoking status: Never   Smokeless tobacco: Never  Substance and Sexual Activity   Alcohol use: Yes    Alcohol/week: 0.0 standard drinks of alcohol    Comment: rarely,    Drug use: No   Sexual activity: Not on file  Other Topics Concern   Not on file  Social History Narrative   4th marriage-    Daughter born 2002   Son 1982-lives in [place]   Son 1985- in [place]   Drives a truck   Completed 9th grade- GED   Enjoys working on cars,     Social Determinants of Corporate investment banker Strain: Not on [employer] Insecurity: Not on file  Transportation Needs: Not on file  Physical Activity: Not on file  Stress: Not on file  Social Connections: Not on file  Intimate  Partner Violence: Not on file    Outpatient Medications Prior to Visit  Medication Sig Dispense Refill   amLODipine (NORVASC) 10 MG tablet TAKE 1 TABLET(10 MG) BY MOUTH DAILY 90 tablet 1   aspirin EC 81 MG tablet Take 1 tablet (81 mg total) by mouth daily.     Blood Glucose Monitoring Suppl (ONETOUCH VERIO) w/Device KIT Test blood sugar once daily. Dx Code: E11.9 1 kit 0   carvedilol (COREG) 3.125 MG tablet TAKE 1 TABLET(3.125 MG) BY MOUTH TWICE DAILY WITH A MEAL 60 tablet 3   cetirizine (ZYRTEC) 10 MG tablet Take 10 mg by mouth daily as needed for allergies.     cloNIDine (CATAPRES) 0.1 MG tablet TAKE 1 TABLET(0.1 MG) BY MOUTH THREE TIMES DAILY 270 tablet 0   glucose blood (ONETOUCH VERIO) test strip Use to check blood sugar once a day.  Dx Code: E11.9 100 each 5   Insulin Pen Needle (B-D UF III MINI PEN NEEDLES) 31G X 5 MM MISC USE WITH  INSULIN INJECTION ONCE A DAY 100 each 3   lisinopril-hydrochlorothiazide (ZESTORETIC) 20-25 MG tablet Take 1 tablet by mouth daily. 90 tablet 1   ONETOUCH DELICA LANCETS FINE MISC Reported on 05/04/2015  11   pravastatin (PRAVACHOL) 80 MG tablet TAKE 1 TABLET(80 MG) BY MOUTH DAILY 90 tablet 1   tadalafil (CIALIS) 10 MG tablet Take 1 tablet (10 mg total) by mouth daily as needed for erectile dysfunction. 10 tablet 5   Dulaglutide (TRULICITY) 0.75 MG/0.5ML SOPN Inject 0.75 mg into the skin once a week. 2 mL 2   metFORMIN (GLUCOPHAGE) 1000 MG tablet Take 1 tablet (1,000 mg total) by mouth 2 (two) times daily with a meal. 180 tablet 0   No facility-administered medications prior to visit.    Allergies  Allergen Reactions   Atorvastatin Other (See Comments)    Muscle aches    ROS    See HPI Objective:    Physical Exam Constitutional:      General: He is not in acute distress.    Appearance: Normal appearance.  HENT:     Head: Normocephalic and atraumatic.     Right Ear: External ear normal.     Left Ear: External ear normal.  Eyes:     Extraocular Movements: Extraocular movements intact.     Pupils: Pupils are equal, round, and reactive to light.  Cardiovascular:     Rate and Rhythm: Normal rate and regular rhythm.     Heart sounds: Normal heart sounds. No murmur heard.    No gallop.  Pulmonary:     Effort: Pulmonary effort is normal. No respiratory distress.     Breath sounds: Normal breath sounds. No wheezing or rales.  Skin:    General: Skin is warm.  Neurological:     Mental Status: He is alert and oriented to person, place, and time.  Psychiatric:        Judgment: Judgment normal.     BP 136/72 (BP Location: Right Arm, Patient Position: Sitting, Cuff Size: Small)   Pulse 62   Temp 98.1 F (36.7 C) (Oral)   Resp 16   Wt 205 lb (93 kg)   SpO2 99%   BMI 31.17 kg/m  Wt Readings from Last 3 Encounters:  07/03/22 205 lb (93 kg)  03/29/22 199 lb (90.3 kg)  12/21/21  199 lb (90.3 kg)       Assessment & Plan:  Primary hypertension Assessment & Plan: At goal, continue amlodipine, carvedilol, catapres,  zestoretic.  BP Readings from Last 3 Encounters:  07/03/22 136/72  03/29/22 139/69  12/21/21 124/62      Type 2 diabetes mellitus with diabetic nephropathy, without long-term current use of insulin Assessment & Plan: Lab Results  Component Value Date   HGBA1C 11.1 (H) 03/29/2022   HGBA1C 12.0 (H) 12/21/2021   HGBA1C 7.6 (H) 09/27/2020   Lab Results  Component Value Date   MICROALBUR 0.7 12/21/2021   LDLCALC 118 (H) 12/21/2021   CREATININE 0.81 03/29/2022   He has had trulicity intermittently due to drug availability. Ran out of metformin and got confused so thought that we wanted him to stop it since he had no refills at his pharmacy.   Orders: -     Hemoglobin A1c -     Comprehensive metabolic panel  Hyperlipidemia, unspecified hyperlipidemia type Assessment & Plan: Lab Results  Component Value Date   CHOL 190 12/21/2021   HDL 42.40 12/21/2021   LDLCALC 118 (H) 12/21/2021   LDLDIRECT 131.0 12/23/2014   TRIG 151.0 (H) 12/21/2021   CHOLHDL 4 12/21/2021   LDL was slightly above goal last visit on pravastatin 80mg . Will continue same.   Orders: -     Lipid panel  Other orders -     metFORMIN HCl; Take 1 tablet (1,000 mg total) by mouth 2 (two) times daily with a meal.  Dispense: 180 tablet; Refill: 1 -     Trulicity; Inject 0.75 mg into the skin once a week.  Dispense: 6 mL; Refill: 3    I, Lemont FillersMelissa S O'Sullivan, NP, personally preformed the services described in this documentation.  All medical record entries made by the scribe were at my direction and in my presence.  I have reviewed the chart and discharge instructions (if applicable) and agree that the record reflects my personal performance and is accurate and complete. 07/03/2022  Lemont FillersMelissa S O'Sullivan, NP   [place] PodI,Kennedy C Lynn,acting as a scribe for Lemont FillersMelissa S O'Sullivan,  NP.,have documented all relevant documentation on the behalf of Lemont FillersMelissa S O'Sullivan, NP,as directed by  Lemont FillersMelissa S O'Sullivan, NP while in the presence of Lemont FillersMelissa S O'Sullivan, NP.

## 2022-07-04 ENCOUNTER — Telehealth: Payer: Self-pay | Admitting: Family

## 2022-07-04 NOTE — Telephone Encounter (Signed)
Sugar remains very high.  As we discussed at his visit, please restart metformin.  Call me if he is unable to get the Trulicity and I will change it.  Check fasting sugar once daily for 1 week and then send [place] those readings or call with numbers.

## 2022-07-04 NOTE — Telephone Encounter (Signed)
Patient notified of results and new medications. He reports he received a call to pick up the jardiance at the pharmacy. He will send one week of fasting blood glucose results

## 2022-09-14 ENCOUNTER — Other Ambulatory Visit: Payer: Self-pay | Admitting: Family

## 2022-10-04 ENCOUNTER — Ambulatory Visit: Payer: BC Managed Care – PPO | Admitting: Family

## 2022-10-04 ENCOUNTER — Encounter: Payer: Self-pay | Admitting: Family

## 2022-10-04 VITALS — BP 135/75 | HR 72 | Ht 70.0 in | Wt 201.8 lb

## 2022-10-04 DIAGNOSIS — Z7985 Long-term (current) use of injectable non-insulin antidiabetic drugs: Secondary | ICD-10-CM

## 2022-10-04 DIAGNOSIS — E785 Hyperlipidemia, unspecified: Secondary | ICD-10-CM | POA: Diagnosis not present

## 2022-10-04 DIAGNOSIS — E1121 Type 2 diabetes mellitus with diabetic nephropathy: Secondary | ICD-10-CM | POA: Diagnosis not present

## 2022-10-04 DIAGNOSIS — I10 Essential (primary) hypertension: Secondary | ICD-10-CM | POA: Diagnosis not present

## 2022-10-04 LAB — BASIC METABOLIC PANEL
BUN: 23 mg/dL (ref 6–23)
CO2: 32 mEq/L (ref 19–32)
Calcium: 9.9 mg/dL (ref 8.4–10.5)
Chloride: 97 mEq/L (ref 96–112)
Creatinine, Ser: 1.33 mg/dL (ref 0.40–1.50)
GFR: 55.72 mL/min — ABNORMAL LOW (ref >60.00)
Glucose, Bld: 288 mg/dL — ABNORMAL HIGH (ref 70–99)
Potassium: 4.2 mEq/L (ref 3.5–5.1)
Sodium: 138 mEq/L (ref 135–145)

## 2022-10-04 LAB — HEMOGLOBIN A1C: Hgb A1c MFr Bld: 10.4 % — ABNORMAL HIGH (ref 4.6–6.5)

## 2022-10-04 MED ORDER — TRULICITY 1.5 MG/0.5ML ~~LOC~~ SOAJ: 1.5000 mg | SUBCUTANEOUS | 1 refills | Status: DC

## 2022-10-04 MED ORDER — FREESTYLE LIBRE 3 SENSOR MISC: 5 refills | Status: AC

## 2022-10-04 NOTE — Patient Instructions (Signed)
VISIT SUMMARY:  During your visit, we discussed your ongoing management of diabetes, hypertension, and hyperlipidemia. You mentioned having difficulty obtaining your prescribed Trulicity, which has led to high blood sugar levels. You also reported a diet high in sweets and issues with your glucose meter. Your blood pressure was slightly elevated, and you reported muscle aches related to your pravastatin medication.  YOUR PLAN:  -TYPE 2 DIABETES MELLITUS: This is a condition where your body doesn't use insulin properly, leading to high blood sugar levels. We will increase your Trulicity dosage to 1.5mg  weekly and order a Freestyle Libre for continuous glucose monitoring. We will also check your A1c and glucose levels today.  -HYPERTENSION: This is a condition where the force of the blood against your artery walls is too high. We will recheck your blood pressure today.  -HYPERLIPIDEMIA: This is a condition where there are high levels of fats (lipids) in your blood. Despite the muscle aches, you should continue taking pravastatin as it is controlling your condition well.  INSTRUCTIONS:  We will check your kidney function today. Please schedule a follow-up appointment in 3 months. We will contact you with your A1c results. Please continue to take your medications as prescribed and try to limit your intake of sweets to help manage your blood sugar levels.

## 2022-10-04 NOTE — Assessment & Plan Note (Signed)
BP at goal. Continue current meds.  °

## 2022-10-04 NOTE — Assessment & Plan Note (Signed)
Last LDL at goal.  Continue pravastatin.

## 2022-10-04 NOTE — Assessment & Plan Note (Addendum)
Not checking sugars.  Suspect that his A1C will still be above goal.  Will increase trulicity to 1.5mg  to help with weight loss as well as sugar.  He is down 4 pounds since last visit.   Update A1c, will also order a Freestyle continue glucose monitor.

## 2022-10-04 NOTE — Progress Notes (Signed)
Subjective:     Patient ID: Paul Yu, male    DOB: 1955/06/12, 67 y.o.   MRN: [id number]  Chief Complaint  Patient presents with   Follow Up    3 month f/u no questions/concerns     HPI  Discussed the use of AI scribe software for clinical note transcription with the patient, who gave verbal consent to proceed.  History of Present Illness   The patient, with a history of diabetes, presents for follow-up. He reported difficulty obtaining his prescribed Trulicity last visit but since that time [facility].  which has led to high blood sugar levels. He admits to a diet high in sweets, which may be contributing to his elevated blood sugar levels. He has not been able to monitor his blood sugar levels at home due to issues with his glucose meter.  The patient also has a history of hyperlipidemia, which is well-managed on pravastatin, despite initial muscle aches.       Wt Readings from Last 3 Encounters:  10/04/22 201 lb 12.8 oz (91.5 kg)  07/03/22 205 lb (93 kg)  03/29/22 199 lb (90.3 kg)      There are no preventive care reminders to display for this patient.  Past Medical History:  Diagnosis Date   Diabetes mellitus without complication [facility])     Past Surgical History:  Procedure Laterality Date   LEFT HEART CATHETERIZATION WITH CORONARY ANGIOGRAM N/A 07/15/2014   Procedure: LEFT HEART CATHETERIZATION WITH CORONARY ANGIOGRAM;  Surgeon: Paul Bollman, MD;  Location: [facility] CATH LAB;  Service: Cardiovascular;  Laterality: N/A;   TONSILLECTOMY  1966    Family History  Problem Relation Age of Onset   Cancer Mother        ?lung   Diabetes Maternal Grandmother    Heart disease Neg Hx     Social History   Socioeconomic History   Marital status: Married    Spouse name: Not on file   Number of children: Not on file   Years of education: Not on file   Highest education level: Not on file  Occupational History    Not on file  Tobacco Use   Smoking status: Never   Smokeless tobacco: Never  Substance and Sexual Activity   Alcohol use: Yes    Alcohol/week: 0.0 standard drinks of alcohol    Comment: rarely,    Drug use: No   Sexual activity: Not on file  Other Topics Concern   Not on file  Social History Narrative   4th marriage-    Daughter born 2002   Son 1982-lives in [place]   Son 1985- in [place]   Drives a truck   Completed 9th grade- GED   Enjoys working on cars,     Social Determinants of Corporate investment banker Strain: Not on [employer] Insecurity: Not on file  Transportation Needs: Not on file  Physical Activity: Not on file  Stress: Not on file  Social Connections: Not on file  Intimate Partner Violence: Not on file    Outpatient Medications Prior to Visit  Medication Sig Dispense Refill   amLODipine (NORVASC) 10 MG tablet TAKE 1 TABLET(10 MG) BY MOUTH DAILY 90 tablet 1   aspirin EC 81 MG tablet Take 1 tablet (81 mg total) by mouth daily.     Blood Glucose Monitoring Suppl (ONETOUCH VERIO) w/Device KIT Test blood sugar once daily. Dx Code: E11.9 1 kit 0  carvedilol (COREG) 3.125 MG tablet TAKE 1 TABLET(3.125 MG) BY MOUTH TWICE DAILY WITH A MEAL 60 tablet 3   cetirizine (ZYRTEC) 10 MG tablet Take 10 mg by mouth daily as needed for allergies.     cloNIDine (CATAPRES) 0.1 MG tablet Take 1 tablet (0.1 mg total) by mouth 3 (three) times daily. 270 tablet 0   glucose blood (ONETOUCH VERIO) test strip Use to check blood sugar once a day.  Dx Code: E11.9 100 each 5   Insulin Pen Needle (B-D UF III MINI PEN NEEDLES) 31G X 5 MM MISC USE WITH INSULIN INJECTION ONCE A DAY 100 each 3   lisinopril-hydrochlorothiazide (ZESTORETIC) 20-25 MG tablet Take 1 tablet by mouth daily. 90 tablet 1   metFORMIN (GLUCOPHAGE) 1000 MG tablet Take 1 tablet (1,000 mg total) by mouth 2 (two) times daily with a meal. 180 tablet 1   ONETOUCH DELICA LANCETS FINE MISC Reported on 05/04/2015  11    pravastatin (PRAVACHOL) 80 MG tablet TAKE 1 TABLET(80 MG) BY MOUTH DAILY 90 tablet 1   tadalafil (CIALIS) 10 MG tablet Take 1 tablet (10 mg total) by mouth daily as needed for erectile dysfunction. 10 tablet 5   Dulaglutide (TRULICITY) 0.75 MG/0.5ML SOPN Inject 0.75 mg into the skin once a week. 6 mL 3   No facility-administered medications prior to visit.    Allergies  Allergen Reactions   Atorvastatin Other (See Comments)    Muscle aches    ROS See HPI    Objective:    Physical Exam Constitutional:      General: He is not in acute distress.    Appearance: He is well-developed.  HENT:     Head: Normocephalic and atraumatic.  Cardiovascular:     Rate and Rhythm: Normal rate and regular rhythm.     Heart sounds: No murmur heard. Pulmonary:     Effort: Pulmonary effort is normal. No respiratory distress.     Breath sounds: Normal breath sounds. No wheezing or rales.  Skin:    General: Skin is warm and dry.  Neurological:     Mental Status: He is alert and oriented to person, place, and time.  Psychiatric:        Behavior: Behavior normal.        Thought Content: Thought content normal.      BP 135/75   Pulse 72   Ht 5\' 10"  (1.778 m)   Wt 201 lb 12.8 oz (91.5 kg)   SpO2 99%   BMI 28.96 kg/m  Wt Readings from Last 3 Encounters:  10/04/22 201 lb 12.8 oz (91.5 kg)  07/03/22 205 lb (93 kg)  03/29/22 199 lb (90.3 kg)       Assessment & Plan:   Problem List Items Addressed This Visit       Unprioritized   Hyperlipidemia    Last LDL at goal.  Continue pravastatin.       HTN (hypertension)    BP at goal. Continue current meds.       DM2 (diabetes mellitus, type 2) (HCC) - Primary    Not checking sugars.  Suspect that his A1C will still be above goal.  Will increase trulicity to 1.5mg  to help with weight loss as well as sugar.  He is down 4 pounds since last visit.   Update A1c, will also order a Freestyle continue glucose monitor.       Relevant  Medications   Dulaglutide (TRULICITY) 1.5 MG/0.5ML SOPN   Other Relevant Orders  Basic Metabolic Panel (BMET)   HgB A1c    I have discontinued Paul Yu. Paul Yu's Trulicity. I am also having him start on Trulicity and FreeStyle Libre 3 Sensor. Additionally, I am having him maintain his aspirin EC, OneTouch Delica Lancets Fine, cetirizine, OneTouch Verio, OneTouch Verio, B-D UF III MINI PEN NEEDLES, tadalafil, lisinopril-hydrochlorothiazide, amLODipine, pravastatin, metFORMIN, cloNIDine, and carvedilol.  Meds ordered this encounter  Medications   Dulaglutide (TRULICITY) 1.5 MG/0.5ML SOPN    Sig: Inject 1.5 mg into the skin once a week.    Dispense:  6 mL    Refill:  1    Please cancel rx for 0.75mg  dose    Order Specific Question:   Supervising Provider    Answer:   Danise Edge A [[id number]]   Continuous Glucose Sensor (FREESTYLE LIBRE 3 SENSOR) MISC    Sig: Apply one sensor every 14 days.    Dispense:  6 each    Refill:  5    Order Specific Question:   Supervising Provider    Answer:   Danise Edge A [[id number]]

## 2022-10-05 ENCOUNTER — Telehealth: Payer: Self-pay | Admitting: Family

## 2022-10-05 NOTE — Telephone Encounter (Addendum)
Please advise pt that his A1C is improving but still very high at 10.4. I would like him to increase his trulicity like we discussed at his visit.  Send me some updated sugar readings after he has been on trulicity 1.5 for 1 week.  Continue metformin.  We may need to make further medication adjustments prior to his follow up visit.

## 2022-10-07 NOTE — Telephone Encounter (Signed)
Patient notified of results and medication increase. He will start by taking 2 of the .75mg  since he just refilled it for 90 days.  He will send readings on his second week on the medication.

## 2022-11-08 ENCOUNTER — Telehealth: Payer: Self-pay | Admitting: Family

## 2022-11-08 NOTE — Telephone Encounter (Signed)
Please send in refill of Trulicity 1.5 mg/o.5 ml to [facility].

## 2022-11-11 ENCOUNTER — Other Ambulatory Visit: Payer: Self-pay

## 2022-11-11 MED ORDER — TRULICITY 1.5 MG/0.5ML ~~LOC~~ SOAJ: 1.5000 mg | SUBCUTANEOUS | 1 refills | Status: DC

## 2022-11-12 NOTE — Telephone Encounter (Signed)
Rx sent yesterday

## 2022-12-09 ENCOUNTER — Other Ambulatory Visit: Payer: Self-pay | Admitting: Family

## 2023-01-03 ENCOUNTER — Ambulatory Visit: Payer: BC Managed Care – PPO | Admitting: Family

## 2023-01-10 ENCOUNTER — Ambulatory Visit: Payer: BC Managed Care – PPO | Admitting: Family

## 2023-01-15 ENCOUNTER — Ambulatory Visit: Payer: BC Managed Care – PPO | Admitting: Family

## 2023-01-15 VITALS — BP 132/65 | HR 63 | Temp 97.6°F | Resp 16 | Ht 70.0 in | Wt 207.0 lb

## 2023-01-15 DIAGNOSIS — E785 Hyperlipidemia, unspecified: Secondary | ICD-10-CM

## 2023-01-15 DIAGNOSIS — Z7985 Long-term (current) use of injectable non-insulin antidiabetic drugs: Secondary | ICD-10-CM

## 2023-01-15 DIAGNOSIS — Z7984 Long term (current) use of oral hypoglycemic drugs: Secondary | ICD-10-CM

## 2023-01-15 DIAGNOSIS — I2510 Atherosclerotic heart disease of native coronary artery without angina pectoris: Secondary | ICD-10-CM | POA: Diagnosis not present

## 2023-01-15 DIAGNOSIS — I420 Dilated cardiomyopathy: Secondary | ICD-10-CM

## 2023-01-15 DIAGNOSIS — E1121 Type 2 diabetes mellitus with diabetic nephropathy: Secondary | ICD-10-CM | POA: Diagnosis not present

## 2023-01-15 DIAGNOSIS — I499 Cardiac arrhythmia, unspecified: Secondary | ICD-10-CM | POA: Insufficient documentation

## 2023-01-15 DIAGNOSIS — R635 Abnormal weight gain: Secondary | ICD-10-CM | POA: Diagnosis not present

## 2023-01-15 DIAGNOSIS — I10 Essential (primary) hypertension: Secondary | ICD-10-CM

## 2023-01-15 LAB — BASIC METABOLIC PANEL
BUN: 11 mg/dL (ref 6–23)
CO2: 29 meq/L (ref 19–32)
Calcium: 9.5 mg/dL (ref 8.4–10.5)
Chloride: 97 meq/L (ref 96–112)
Creatinine, Ser: 0.85 mg/dL (ref 0.40–1.50)
GFR: 90.41 mL/min (ref >60.00)
Glucose, Bld: 176 mg/dL — ABNORMAL HIGH (ref 70–99)
Potassium: 3.7 meq/L (ref 3.5–5.1)
Sodium: 137 meq/L (ref 135–145)

## 2023-01-15 LAB — MICROALBUMIN / CREATININE URINE RATIO
Creatinine,U: 64.4 mg/dL
Microalb Creat Ratio: 3.4 mg/g (ref 0.0–30.0)
Microalb, Ur: 2.2 mg/dL — ABNORMAL HIGH (ref 0.0–1.9)

## 2023-01-15 LAB — HEMOGLOBIN A1C: Hgb A1c MFr Bld: 9.2 % — ABNORMAL HIGH (ref 4.6–6.5)

## 2023-01-15 NOTE — Progress Notes (Signed)
Subjective:     Patient ID: Paul Yu, male    DOB: 01-29-1956, 67 y.o.   MRN: [id number]  Chief Complaint  Patient presents with   Diabetes    Here for follow up   Hypertension    Here for follow up    Diabetes Pertinent negatives for hypoglycemia include no dizziness or headaches. Pertinent negatives for diabetes include no chest pain and no weakness.  Hypertension Pertinent negatives include no chest pain, headaches, malaise/fatigue, orthopnea, PND or shortness of breath.    Discussed the use of AI scribe software for clinical note transcription with the patient, who gave verbal consent to proceed.   Patient is a 67 year old male that presents to the clinic today for follow up for hypertension and diabetes. Patient is currently on amlodipine, carvedilol and lisinopril-hydrochlorothiazide for blood pressure. He states that he went to work this morning for a few hours and did not take his blood pressure medication before coming here.   Patient denies any chest pain, headaches or lightheadedness at home.   He denies taking blood pressure at home.  Patient states that he does not exercise. He states he does not eat that healthy.   Denies checking blood sugar at home. Patient takes trulicity and metformin for blood sugar. States that he forgets to check his blood sugar. He states he does have      Health Maintenance Due  Topic Date Due   Colonoscopy  Never done   Diabetic kidney evaluation - Urine ACR  12/22/2022   FOOT EXAM  12/22/2022    Past Medical History:  Diagnosis Date   Diabetes mellitus without complication [facility])     Past Surgical History:  Procedure Laterality Date   LEFT HEART CATHETERIZATION WITH CORONARY ANGIOGRAM N/A 07/15/2014   Procedure: LEFT HEART CATHETERIZATION WITH CORONARY ANGIOGRAM;  Surgeon: Tonny Bollman, MD;  Location: [facility] CATH LAB;  Service: Cardiovascular;  Laterality: N/A;   TONSILLECTOMY  1966    Family History  Problem  Relation Age of Onset   Cancer Mother        ?lung   Diabetes Maternal Grandmother    Heart disease Neg Hx     Social History   Socioeconomic History   Marital status: Married    Spouse name: Not on file   Number of children: Not on file   Years of education: Not on file   Highest education level: Not on file  Occupational History   Not on file  Tobacco Use   Smoking status: Never   Smokeless tobacco: Never  Substance and Sexual Activity   Alcohol use: Yes    Alcohol/week: 0.0 standard drinks of alcohol    Comment: rarely,    Drug use: No   Sexual activity: Not on file  Other Topics Concern   Not on file  Social History Narrative   4th marriage-    Daughter born 2002   Son 1982-lives in [place]   Son 1985- in [place]   Drives a truck   Completed 9th grade- GED   Enjoys working on cars,     Social Determinants of Corporate investment banker Strain: Not on Ship broker Insecurity: Not on file  Transportation Needs: Not on file  Physical Activity: Not on file  Stress: Not on file  Social Connections: Not on file  Intimate Partner Violence: Not on file    Outpatient Medications Prior to Visit  Medication Sig Dispense Refill   amLODipine (NORVASC)  10 MG tablet TAKE 1 TABLET(10 MG) BY MOUTH DAILY 90 tablet 1   aspirin EC 81 MG tablet Take 1 tablet (81 mg total) by mouth daily.     Blood Glucose Monitoring Suppl (ONETOUCH VERIO) w/Device KIT Test blood sugar once daily. Dx Code: E11.9 1 kit 0   carvedilol (COREG) 3.125 MG tablet TAKE 1 TABLET(3.125 MG) BY MOUTH TWICE DAILY WITH A MEAL 60 tablet 3   cetirizine (ZYRTEC) 10 MG tablet Take 10 mg by mouth daily as needed for allergies.     cloNIDine (CATAPRES) 0.1 MG tablet Take 1 tablet (0.1 mg total) by mouth 3 (three) times daily. 270 tablet 0   Continuous Glucose Sensor (FREESTYLE LIBRE 3 SENSOR) MISC Apply one sensor every 14 days. 6 each 5   Dulaglutide (TRULICITY) 1.5 MG/0.5ML SOPN Inject 1.5 mg into the skin  once a week. 6 mL 1   lisinopril-hydrochlorothiazide (ZESTORETIC) 20-25 MG tablet Take 1 tablet by mouth daily. 90 tablet 1   metFORMIN (GLUCOPHAGE) 1000 MG tablet TAKE 1 TABLET TWICE A DAY WITH A MEAL 180 tablet 1   ONETOUCH DELICA LANCETS FINE MISC Reported on 05/04/2015  11   pravastatin (PRAVACHOL) 80 MG tablet TAKE 1 TABLET(80 MG) BY MOUTH DAILY 90 tablet 1   tadalafil (CIALIS) 10 MG tablet Take 1 tablet (10 mg total) by mouth daily as needed for erectile dysfunction. 10 tablet 5   glucose blood (ONETOUCH VERIO) test strip Use to check blood sugar once a day.  Dx Code: E11.9 100 each 5   Insulin Pen Needle (B-D UF III MINI PEN NEEDLES) 31G X 5 MM MISC USE WITH INSULIN INJECTION ONCE A DAY 100 each 3   No facility-administered medications prior to visit.    Allergies  Allergen Reactions   Atorvastatin Other (See Comments)    Muscle aches    Review of Systems  Constitutional:  Negative for chills and malaise/fatigue.  HENT:  Negative for congestion, sore throat and tinnitus.   Eyes:  Negative for pain.  Respiratory:  Negative for cough and shortness of breath.   Cardiovascular:  Negative for chest pain, orthopnea and PND.  Gastrointestinal:  Negative for diarrhea and vomiting.  Genitourinary:  Negative for dysuria.  Musculoskeletal:  Negative for back pain.  Skin:  Negative for itching.  Neurological:  Negative for dizziness, weakness and headaches.  Psychiatric/Behavioral:  Negative for depression.        Objective:    Physical Exam Vitals reviewed.  Constitutional:      Appearance: Normal appearance. He is normal weight.  HENT:     Head: Normocephalic.     Nose: Nose normal.  Cardiovascular:     Rate and Rhythm: Normal rate and regular rhythm.     Pulses: Normal pulses.     Heart sounds: Normal heart sounds.  Pulmonary:     Effort: Pulmonary effort is normal.     Breath sounds: Normal breath sounds.  Musculoskeletal:        General: Normal range of motion.      Cervical back: Normal range of motion.  Skin:    General: Skin is warm.     Capillary Refill: Capillary refill takes less than 2 seconds.  Neurological:     General: No focal deficit present.     Mental Status: He is alert and oriented to person, place, and time. Mental status is at baseline.  Psychiatric:        Mood and Affect: Mood normal.  Behavior: Behavior normal.        Thought Content: Thought content normal.        Judgment: Judgment normal.      BP (!) 147/51 (BP Location: Right Arm, Patient Position: Sitting, Cuff Size: Large)   Pulse 63   Temp 97.6 F (36.4 C) (Oral)   Resp 16   Ht 5\' 10"  (1.778 m)   Wt 207 lb (93.9 kg)   SpO2 98%   BMI 29.70 kg/m  Wt Readings from Last 3 Encounters:  01/15/23 207 lb (93.9 kg)  10/04/22 201 lb 12.8 oz (91.5 kg)  07/03/22 205 lb (93 kg)       Assessment & Plan:   Problem List Items Addressed This Visit   None   I have discontinued [name]. Kil's B-D UF III MINI PEN NEEDLES. I am also having him maintain his aspirin EC, OneTouch Delica Lancets Fine, cetirizine, OneTouch Verio, tadalafil, lisinopril-hydrochlorothiazide, amLODipine, pravastatin, cloNIDine, carvedilol, FreeStyle Libre 3 Sensor, Trulicity, and metFORMIN.  No orders of the defined types were placed in this encounter.

## 2023-01-15 NOTE — Assessment & Plan Note (Addendum)
Stable since last lab work drawn. Continue taking Pravastatin  Lab Results  Component Value Date   CHOL 113 07/03/2022   HDL 41.80 07/03/2022   LDLCALC 52 07/03/2022   LDLDIRECT 131.0 12/23/2014   TRIG 97.0 07/03/2022   CHOLHDL 3 07/03/2022

## 2023-01-15 NOTE — Patient Instructions (Signed)
VISIT SUMMARY:  Mr. Stitzel, you came in today for your regular follow-up. We discussed your challenges with dietary control and recent weight gain, as well as your sedentary lifestyle due to your job. We also talked about your diabetes management and the importance of monitoring your blood sugar levels. Additionally, we reviewed your heart health and the need for regular cardiology follow-ups.  YOUR PLAN:  -DIABETES MELLITUS: Diabetes Mellitus is a condition where your blood sugar levels are too high. We discussed the importance of diet control and regular blood sugar monitoring. Please continue taking Trulicity as prescribed. Check with your pharmacy about the status of your glucose monitor prescription. Try to make healthier snacking and meal planning choices.  -HYPERLIPIDEMIA: Hyperlipidemia means you have high levels of fats in your blood, which is well controlled with Pravastatin. Please continue taking Pravastatin as prescribed.  -HYPERTENSION: Hypertension is high blood pressure, which is controlled with your current medications. Please continue taking Clonidine, Amlodipine, and Lisinopril as prescribed.  -CORONARY ARTERY DISEASE AND CARDIOMYOPATHY: Coronary Artery Disease and Cardiomyopathy are conditions that affect your heart. We noted an irregular heart rate today and will order an EKG to check for possible atrial fibrillation. It is important to follow up with cardiology annually.  -WEIGHT MANAGEMENT: Your weight has increased due to poor dietary habits. We encourage you to make healthier food choices and control your portion sizes.  INSTRUCTIONS:  Please follow up with the cardiologist annually and get an EKG as ordered. Check with your pharmacy about the status of your glucose monitor prescription.

## 2023-01-15 NOTE — Assessment & Plan Note (Addendum)
  Last echo was stable 2023. Appears clinically euvolemic.

## 2023-01-15 NOTE — Assessment & Plan Note (Signed)
Last echo was stable 2023. Appears clinically euvolemic.

## 2023-01-15 NOTE — Progress Notes (Signed)
Subjective:     Patient ID: Paul Yu, male    DOB: September 05, 1955, 67 y.o.   MRN: [id number]  Chief Complaint  Patient presents with   Diabetes    Here for follow up   Hypertension    Here for follow up     Discussed the use of AI scribe software for clinical note transcription with the patient, who gave verbal consent to proceed.  History of Present Illness   Paul Yu, a patient with a history of coronary artery disease, cardiomyopathy, and diabetes, presents for a regular follow-up. However, he admits to struggling with dietary control, particularly with snacking, which has led to recent weight gain. 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 He has not been monitoring his blood sugar levels at home and has not been able to obtain a continuous glucose monitor due to insurance issues. He has not seen a cardiologist in a while and declines to schedule follow up with them.          Health Maintenance Due  Topic Date Due   Colonoscopy  Never done   Diabetic kidney evaluation - Urine ACR  12/22/2022   FOOT EXAM  12/22/2022    Past Medical History:  Diagnosis Date   Diabetes mellitus without complication [facility])     Past Surgical History:  Procedure Laterality Date   LEFT HEART CATHETERIZATION WITH CORONARY ANGIOGRAM N/A 07/15/2014   Procedure: LEFT HEART CATHETERIZATION WITH CORONARY ANGIOGRAM;  Surgeon: Tonny Bollman, MD;  Location: [facility] CATH LAB;  Service: Cardiovascular;  Laterality: N/A;   TONSILLECTOMY  1966    Family History  Problem Relation Age of Onset   Cancer Mother        ?lung   Diabetes Maternal Grandmother    Heart disease Neg Hx     Social History   Socioeconomic History   Marital status: Married    Spouse name: Not on file   Number of children: Not on file   Years of education: Not on file   Highest education level: Not on file  Occupational History   Not on file  Tobacco Use   Smoking status: Never   Smokeless  tobacco: Never  Substance and Sexual Activity   Alcohol use: Yes    Alcohol/week: 0.0 standard drinks of alcohol    Comment: rarely,    Drug use: No   Sexual activity: Not on file  Other Topics Concern   Not on file  Social History Narrative   4th marriage-    Daughter born 2002   Son 1982-lives in [place]   Son 1985- in [place]   Drives a truck   Completed 9th grade- GED   Enjoys working on cars,     Social Determinants of Corporate investment banker Strain: Not on Ship broker Insecurity: Not on file  Transportation Needs: Not on file  Physical Activity: Not on file  Stress: Not on file  Social Connections: Not on file  Intimate Partner Violence: Not on file    Outpatient Medications Prior to Visit  Medication Sig Dispense Refill   amLODipine (NORVASC) 10 MG tablet TAKE 1 TABLET(10 MG) BY MOUTH DAILY 90 tablet 1   aspirin EC 81 MG tablet Take 1 tablet (81 mg total) by mouth daily.     Blood Glucose Monitoring Suppl (ONETOUCH VERIO) w/Device KIT Test blood sugar once daily. Dx Code: E11.9 1 kit 0   carvedilol (COREG) 3.125 MG tablet  TAKE 1 TABLET(3.125 MG) BY MOUTH TWICE DAILY WITH A MEAL 60 tablet 3   cetirizine (ZYRTEC) 10 MG tablet Take 10 mg by mouth daily as needed for allergies.     cloNIDine (CATAPRES) 0.1 MG tablet Take 1 tablet (0.1 mg total) by mouth 3 (three) times daily. 270 tablet 0   Continuous Glucose Sensor (FREESTYLE LIBRE 3 SENSOR) MISC Apply one sensor every 14 days. 6 each 5   Dulaglutide (TRULICITY) 1.5 MG/0.5ML SOPN Inject 1.5 mg into the skin once a week. 6 mL 1   lisinopril-hydrochlorothiazide (ZESTORETIC) 20-25 MG tablet Take 1 tablet by mouth daily. 90 tablet 1   metFORMIN (GLUCOPHAGE) 1000 MG tablet TAKE 1 TABLET TWICE A DAY WITH A MEAL 180 tablet 1   ONETOUCH DELICA LANCETS FINE MISC Reported on 05/04/2015  11   pravastatin (PRAVACHOL) 80 MG tablet TAKE 1 TABLET(80 MG) BY MOUTH DAILY 90 tablet 1   tadalafil (CIALIS) 10 MG tablet Take 1 tablet  (10 mg total) by mouth daily as needed for erectile dysfunction. 10 tablet 5   glucose blood (ONETOUCH VERIO) test strip Use to check blood sugar once a day.  Dx Code: E11.9 100 each 5   Insulin Pen Needle (B-D UF III MINI PEN NEEDLES) 31G X 5 MM MISC USE WITH INSULIN INJECTION ONCE A DAY 100 each 3   No facility-administered medications prior to visit.    Allergies  Allergen Reactions   Atorvastatin Other (See Comments)    Muscle aches    ROS See HPI    Objective:    Physical Exam Constitutional:      General: He is not in acute distress.    Appearance: He is well-developed.  HENT:     Head: Normocephalic and atraumatic.  Cardiovascular:     Rate and Rhythm: Normal rate. Rhythm irregular.     Heart sounds: No murmur heard. Pulmonary:     Effort: Pulmonary effort is normal. No respiratory distress.     Breath sounds: Normal breath sounds. No wheezing or rales.  Skin:    General: Skin is warm and dry.  Neurological:     Mental Status: He is alert and oriented to person, place, and time.  Psychiatric:        Behavior: Behavior normal.        Thought Content: Thought content normal.      BP 132/65   Pulse 63   Temp 97.6 F (36.4 C) (Oral)   Resp 16   Ht 5\' 10"  (1.778 m)   Wt 207 lb (93.9 kg)   SpO2 98%   BMI 29.70 kg/m  Wt Readings from Last 3 Encounters:  01/15/23 207 lb (93.9 kg)  10/04/22 201 lb 12.8 oz (91.5 kg)  07/03/22 205 lb (93 kg)       Assessment & Plan:   Problem List Items Addressed This Visit       Unprioritized   Weight gain    Wt Readings from Last 3 Encounters:  01/15/23 207 lb (93.9 kg)  10/04/22 201 lb 12.8 oz (91.5 kg)  07/03/22 205 lb (93 kg)   Reports that he is snacking at home. Recommended that he bring lunch with him.        Irregular heart beat    EKG is performed and personally reviewed today. It notes NSR with frequent ventricular ectopy.        Relevant Orders   EKG 12-Lead (Completed)   Hyperlipidemia    Stable  since last  lab work drawn. Continue taking Pravastatin  Lab Results  Component Value Date   CHOL 113 07/03/2022   HDL 41.80 07/03/2022   LDLCALC 52 07/03/2022   LDLDIRECT 131.0 12/23/2014   TRIG 97.0 07/03/2022   CHOLHDL 3 07/03/2022         HTN (hypertension)    He reports that he forgot to take his AM medication.  BP elevated.       DM2 (diabetes mellitus, type 2) (HCC) - Primary    Will recheck A1C level today. Has been very uncontrolled due to poor compliance, poor diet and sedentary activity. He is not motivated to change these behaviors at this time.  Continue trulicity/metformin.       Relevant Orders   HgB A1c   Basic Metabolic Panel (BMET)   Urine Microalbumin w/creat. ratio   DCM (dilated cardiomyopathy) (HCC)     Last echo was stable 2023. Appears clinically euvolemic.       Coronary artery disease    Clinically stable. Continue taking aspirin daily as well as pravastatin.  I strongly encouraged him to see cardiology for follow up but he declines.       Other Visit Diagnoses     Congestive dilated cardiomyopathy (HCC)           I have discontinued [name]. Tippets's B-D UF III MINI PEN NEEDLES. I am also having him maintain his aspirin EC, OneTouch Delica Lancets Fine, cetirizine, OneTouch Verio, tadalafil, lisinopril-hydrochlorothiazide, amLODipine, pravastatin, cloNIDine, carvedilol, FreeStyle Libre 3 Sensor, Trulicity, and metFORMIN.  No orders of the defined types were placed in this encounter.

## 2023-01-15 NOTE — Assessment & Plan Note (Addendum)
Will recheck A1C level today. Has been very uncontrolled due to poor compliance, poor diet and sedentary activity. He is not motivated to change these behaviors at this time.  Continue trulicity/metformin.

## 2023-01-15 NOTE — Assessment & Plan Note (Addendum)
Clinically stable. Continue taking aspirin daily as well as pravastatin.  I strongly encouraged him to see cardiology for follow up but he declines.

## 2023-01-15 NOTE — Assessment & Plan Note (Signed)
He reports that he forgot to take his AM medication.  BP elevated.

## 2023-01-15 NOTE — Assessment & Plan Note (Signed)
EKG is performed and personally reviewed today. It notes NSR with frequent ventricular ectopy.

## 2023-01-15 NOTE — Assessment & Plan Note (Addendum)
Wt Readings from Last 3 Encounters:  01/15/23 207 lb (93.9 kg)  10/04/22 201 lb 12.8 oz (91.5 kg)  07/03/22 205 lb (93 kg)   Reports that he is snacking at home. Recommended that he bring lunch with him.

## 2023-01-16 ENCOUNTER — Other Ambulatory Visit: Payer: Self-pay | Admitting: Family

## 2023-01-16 NOTE — Telephone Encounter (Signed)
Sugar is slightly better but still above goal.  I would like him to work on diet and exercise as we discussed at his visit and to increase his Trulicity dose to 3mg . If his sugar remains high after this adjustment then the next step would be to add insulin.    I have pended rx to be sent to pharmacy of his choice.

## 2023-01-21 MED ORDER — TRULICITY 3 MG/0.5ML ~~LOC~~ SOAJ: 3.0000 mg | SUBCUTANEOUS | 2 refills | Status: DC

## 2023-01-22 ENCOUNTER — Telehealth: Payer: Self-pay | Admitting: Family

## 2023-01-22 ENCOUNTER — Other Ambulatory Visit: Payer: Self-pay

## 2023-01-22 MED ORDER — TRULICITY 3 MG/0.5ML ~~LOC~~ SOAJ: 3.0000 mg | SUBCUTANEOUS | 0 refills | Status: DC

## 2023-01-22 NOTE — Telephone Encounter (Signed)
Rx sent 

## 2023-01-22 NOTE — Telephone Encounter (Signed)
Pt's pharmacy cancelled order for Trulicity because it was only for 1 month. Please send in refill for 3 months to [facility]. Please call pt to confirm.

## 2023-02-17 ENCOUNTER — Telehealth: Payer: Self-pay | Admitting: Family

## 2023-02-17 MED ORDER — LISINOPRIL-HYDROCHLOROTHIAZIDE 20-25 MG PO TABS: 1.0000 | ORAL_TABLET | Freq: Every day | ORAL | 1 refills | Status: DC

## 2023-02-17 NOTE — Telephone Encounter (Signed)
Prescription Request  02/17/2023  Is this a "Controlled Substance" medicine? No  LOV: 01/15/2023  What is the name of the medication or equipment?   lisinopril-hydrochlorothiazide (ZESTORETIC) 20-25   amLODipine (NORVASC) 10 MG table  Have you contacted your pharmacy to request a refill? No   Which pharmacy would you like this sent to?  [id number] - [place], [place] - [address] [place] [address] Phone: [phone] Fax: [phone]   Patient notified that their request is being sent to the clinical staff for review and that they should receive a response within 2 business days.   Please advise at [facility] [phone]

## 2023-02-17 NOTE — Addendum Note (Signed)
Addended by: Wilford Corner on: 02/17/2023 03:45 PM   Modules accepted: Orders

## 2023-03-31 ENCOUNTER — Other Ambulatory Visit: Payer: Self-pay | Admitting: Family

## 2023-04-16 ENCOUNTER — Ambulatory Visit: Payer: BC Managed Care – PPO | Admitting: Family

## 2023-04-16 VITALS — BP 145/92 | HR 71 | Resp 16 | Ht 70.0 in | Wt 204.0 lb

## 2023-04-16 DIAGNOSIS — I2510 Atherosclerotic heart disease of native coronary artery without angina pectoris: Secondary | ICD-10-CM

## 2023-04-16 DIAGNOSIS — Z7985 Long-term (current) use of injectable non-insulin antidiabetic drugs: Secondary | ICD-10-CM

## 2023-04-16 DIAGNOSIS — I420 Dilated cardiomyopathy: Secondary | ICD-10-CM | POA: Diagnosis not present

## 2023-04-16 DIAGNOSIS — I10 Essential (primary) hypertension: Secondary | ICD-10-CM | POA: Diagnosis not present

## 2023-04-16 DIAGNOSIS — Z7984 Long term (current) use of oral hypoglycemic drugs: Secondary | ICD-10-CM

## 2023-04-16 DIAGNOSIS — E1121 Type 2 diabetes mellitus with diabetic nephropathy: Secondary | ICD-10-CM

## 2023-04-16 DIAGNOSIS — E785 Hyperlipidemia, unspecified: Secondary | ICD-10-CM | POA: Diagnosis not present

## 2023-04-16 LAB — HEMOGLOBIN A1C: Hgb A1c MFr Bld: 8.7 % — ABNORMAL HIGH (ref 4.6–6.5)

## 2023-04-16 LAB — LIPID PANEL
Cholesterol: 143 mg/dL (ref 0–200)
HDL: 43.8 mg/dL (ref >39.00)
LDL Cholesterol: 74 mg/dL (ref 0–99)
NonHDL: 99.47
Total CHOL/HDL Ratio: 3
Triglycerides: 128 mg/dL (ref 0.0–149.0)
VLDL: 25.6 mg/dL (ref 0.0–40.0)

## 2023-04-16 MED ORDER — EMPAGLIFLOZIN 10 MG PO TABS: 10.0000 mg | ORAL_TABLET | Freq: Every day | ORAL | 2 refills | Status: DC

## 2023-04-16 NOTE — Patient Instructions (Signed)
VISIT SUMMARY:  Today, we discussed your diabetes, high blood pressure, cholesterol, and heart health. We reviewed your current medications and made some adjustments to help better manage your conditions. We also talked about the importance of regular follow-ups and consistent medication use.  YOUR PLAN:  -TYPE 2 DIABETES MELLITUS: Your blood sugar levels are still higher than we would like, with an HbA1c of 9.2. This means your diabetes is not well controlled, which can lead to long-term complications. We will check your HbA1c today, and if it remains above 7, we will add a new medication called Marcelline Deist to help lower your blood sugar. We also provided information on a manufacturer's coupon for Januvia, which you can consider in the future.  -HYPERTENSION: Your blood pressure is higher than it should be, partly because you are not taking your Carvedilol as prescribed. High blood pressure can lead to serious health issues. Please take your Carvedilol twice a day as directed. We will recheck your blood pressure at your next visit, and if it is still high, we may increase your dose to 6.25mg  twice a day.  -HYPERLIPIDEMIA: Your cholesterol levels were within the target range at your last check, which is good news. We will update your lipid panel today to ensure your cholesterol remains under control.  -CARDIOVASCULAR DISEASE: You have a history of heart disease, and it is important to have regular check-ups with a cardiologist to monitor your heart health and prevent complications. We encourage you to establish care with a cardiologist.  INSTRUCTIONS:  Please follow up in 3 months. Make sure to take your medications as prescribed and attend all recommended appointments. We will recheck your HbA1c and blood pressure at your next visit.

## 2023-04-16 NOTE — Assessment & Plan Note (Signed)
Cardiac Cath 2016 noted the following: 1. Mild - moderate diffuse nonobstructive CAD 2. Known cardiomyopathy (suspect nonischemic)  Explained to patient the importance of cardiology follow up so that they can be proactive with stenting etc. As needed.  He understands risk of heart attack/death but still declines referral.

## 2023-04-16 NOTE — Progress Notes (Signed)
Subjective:     Patient ID: Paul Yu, male    DOB: Aug 12, 1955, 68 y.o.   MRN: [id number]  Chief Complaint  Patient presents with   Diabetes    Here for follow up   Hypertension    Here for follow up    Diabetes  Hypertension    Discussed the use of AI scribe software for clinical note transcription with the patient, who gave verbal consent to proceed.  History of Present Illness   Paul Yu, a patient with a history of diabetes, presents for a follow-up visit. Despite being on metformin and Trulicity, his HbA1c remains elevated at 9.2, down from 10.4. He admits to not making any changes to his diet or exercise regimen. He is aware of the potential damage to his body from prolonged high blood sugar levels. He is open to adding an additional medication if his HbA1c remains above 7.   [name] also has hypertension, which is not well controlled. He admits to only taking his carvedilol once a day, instead of the prescribed twice a day, due to forgetfulness. He is open to adjusting his carvedilol dose if his blood pressure remains high.  [name] has a history of heart disease but is not currently seeing a cardiologist.  He declines to see a cardiologist for necessary follow up due to his belief that he cannot prevent what is meant to be. He expresses a fear of living to an old age where he cannot do anything for himself.      BP Readings from Last 3 Encounters:  04/16/23 (!) 145/92  01/15/23 132/65  10/04/22 135/75       Health Maintenance Due  Topic Date Due   FOOT EXAM  12/22/2022    Past Medical History:  Diagnosis Date   Diabetes mellitus without complication [facility])     Past Surgical History:  Procedure Laterality Date   LEFT HEART CATHETERIZATION WITH CORONARY ANGIOGRAM N/A 07/15/2014   Procedure: LEFT HEART CATHETERIZATION WITH CORONARY ANGIOGRAM;  Surgeon: Tonny Bollman, MD;  Location: [facility] CATH LAB;  Service: Cardiovascular;  Laterality: N/A;   TONSILLECTOMY   1966    Family History  Problem Relation Age of Onset   Cancer Mother        ?lung   Diabetes Maternal Grandmother    Heart disease Neg Hx     Social History   Socioeconomic History   Marital status: Married    Spouse name: Not on file   Number of children: Not on file   Years of education: Not on file   Highest education level: Not on file  Occupational History   Not on file  Tobacco Use   Smoking status: Never   Smokeless tobacco: Never  Substance and Sexual Activity   Alcohol use: Yes    Alcohol/week: 0.0 standard drinks of alcohol    Comment: rarely,    Drug use: No   Sexual activity: Not on file  Other Topics Concern   Not on file  Social History Narrative   4th marriage-    Daughter born 2002   Son 1982-lives in [place]   Son 1985- in [place]   Drives a truck   Completed 9th grade- GED   Enjoys working on cars,     Social Drivers of Corporate investment banker Strain: Not on [employer] Insecurity: Not on file  Transportation Needs: Not on file  Physical Activity: Not on file  Stress: Not on file  Social Connections:  Not on file  Intimate Partner Violence: Not on file    Outpatient Medications Prior to Visit  Medication Sig Dispense Refill   amLODipine (NORVASC) 10 MG tablet TAKE 1 TABLET(10 MG) BY MOUTH DAILY 90 tablet 1   aspirin EC 81 MG tablet Take 1 tablet (81 mg total) by mouth daily.     Blood Glucose Monitoring Suppl (ONETOUCH VERIO) w/Device KIT Test blood sugar once daily. Dx Code: E11.9 1 kit 0   cetirizine (ZYRTEC) 10 MG tablet Take 10 mg by mouth daily as needed for allergies.     cloNIDine (CATAPRES) 0.1 MG tablet Take 1 tablet (0.1 mg total) by mouth 3 (three) times daily. 270 tablet 0   Continuous Glucose Sensor (FREESTYLE LIBRE 3 SENSOR) MISC Apply one sensor every 14 days. 6 each 5   Dulaglutide (TRULICITY) 3 MG/0.5ML SOAJ INJECT 3 MG AS DIRECTED ONCE A WEEK 6 mL 1   lisinopril-hydrochlorothiazide (ZESTORETIC) 20-25 MG tablet  Take 1 tablet by mouth daily. 90 tablet 1   metFORMIN (GLUCOPHAGE) 1000 MG tablet TAKE 1 TABLET TWICE A DAY WITH A MEAL 180 tablet 1   ONETOUCH DELICA LANCETS FINE MISC Reported on 05/04/2015  11   pravastatin (PRAVACHOL) 80 MG tablet TAKE 1 TABLET(80 MG) BY MOUTH DAILY 90 tablet 1   tadalafil (CIALIS) 10 MG tablet Take 1 tablet (10 mg total) by mouth daily as needed for erectile dysfunction. 10 tablet 5   carvedilol (COREG) 3.125 MG tablet TAKE 1 TABLET(3.125 MG) BY MOUTH TWICE DAILY WITH A MEAL 60 tablet 3   No facility-administered medications prior to visit.    Allergies  Allergen Reactions   Atorvastatin Other (See Comments)    Muscle aches    ROS See HPI    Objective:    Physical Exam Constitutional:      General: He is not in acute distress.    Appearance: He is well-developed.  HENT:     Head: Normocephalic and atraumatic.  Cardiovascular:     Rate and Rhythm: Normal rate and regular rhythm.     Heart sounds: No murmur heard. Pulmonary:     Effort: Pulmonary effort is normal. No respiratory distress.     Breath sounds: Normal breath sounds. No wheezing or rales.  Skin:    General: Skin is warm and dry.  Neurological:     Mental Status: He is alert and oriented to person, place, and time.  Psychiatric:        Behavior: Behavior normal.        Thought Content: Thought content normal.      BP (!) 145/92   Pulse 71   Resp 16   Ht 5\' 10"  (1.778 m)   Wt 204 lb (92.5 kg)   SpO2 98%   BMI 29.27 kg/m  Wt Readings from Last 3 Encounters:  04/16/23 204 lb (92.5 kg)  01/15/23 207 lb (93.9 kg)  10/04/22 201 lb 12.8 oz (91.5 kg)       Assessment & Plan:   Problem List Items Addressed This Visit       Unprioritized   Hyperlipidemia   Lab Results  Component Value Date   CHOL 113 07/03/2022   HDL 41.80 07/03/2022   LDLCALC 52 07/03/2022   LDLDIRECT 131.0 12/23/2014   TRIG 97.0 07/03/2022   CHOLHDL 3 07/03/2022   Stable on pravastatin will update today.        Relevant Orders   Lipid panel   HTN (hypertension) - Primary  Elevated blood pressure today, patient admits to inconsistent use of Carvedilol (only once daily instead of BID). -Encourage consistent use of Carvedilol BID. -Recheck blood pressure at next visit. If still elevated, increase Carvedilol to 6.25mg  BID.       DM2 (diabetes mellitus, type 2) (HCC)   Lab Results  Component Value Date   HGBA1C 9.2 (H) 01/15/2023   HGBA1C 10.4 (H) 10/04/2022   HGBA1C 11.1 (H) 07/03/2022   Lab Results  Component Value Date   MICROALBUR 2.2 (H) 01/15/2023   LDLCALC 52 07/03/2022   CREATININE 0.85 01/15/2023    Poorly controlled with A1c of 9.2 (goal <7). Currently on Metformin and Trulicity. No changes in diet or exercise. Discussed the need for additional medication to achieve glycemic control and prevent long-term complications. -Check A1c today. -add Farxiga.      Relevant Medications   empagliflozin (JARDIANCE) 10 MG TABS tablet   Other Relevant Orders   HgB A1c   DCM (dilated cardiomyopathy) (HCC)   Declines recommended follow up with cardiology.       Coronary artery disease   Cardiac Cath 2016 noted the following: 1. Mild - moderate diffuse nonobstructive CAD 2. Known cardiomyopathy (suspect nonischemic)  Explained to patient the importance of cardiology follow up so that they can be proactive with stenting etc. As needed.  He understands risk of heart attack/death but still declines referral.        I have discontinued [name]. Dauzat's carvedilol. I am also having him start on empagliflozin. Additionally, I am having him maintain his aspirin EC, OneTouch Delica Lancets Fine, cetirizine, OneTouch Verio, tadalafil, amLODipine, pravastatin, cloNIDine, FreeStyle Libre 3 Sensor, metFORMIN, lisinopril-hydrochlorothiazide, and Trulicity.  Meds ordered this encounter  Medications   empagliflozin (JARDIANCE) 10 MG TABS tablet    Sig: Take 1 tablet (10 mg total) by  mouth daily before breakfast.    Dispense:  30 tablet    Refill:  2    Supervising Provider:   Danise Edge A [[id number]]

## 2023-04-16 NOTE — Assessment & Plan Note (Addendum)
  Elevated blood pressure today, patient admits to inconsistent use of Carvedilol (only once daily instead of BID). -Encourage consistent use of Carvedilol BID. -Recheck blood pressure at next visit. If still elevated, increase Carvedilol to 6.25mg  BID.

## 2023-04-16 NOTE — Assessment & Plan Note (Signed)
Lab Results  Component Value Date   CHOL 113 07/03/2022   HDL 41.80 07/03/2022   LDLCALC 52 07/03/2022   LDLDIRECT 131.0 12/23/2014   TRIG 97.0 07/03/2022   CHOLHDL 3 07/03/2022   Stable on pravastatin will update today.

## 2023-04-16 NOTE — Assessment & Plan Note (Signed)
Declines recommended follow up with cardiology.

## 2023-04-16 NOTE — Assessment & Plan Note (Addendum)
Lab Results  Component Value Date   HGBA1C 9.2 (H) 01/15/2023   HGBA1C 10.4 (H) 10/04/2022   HGBA1C 11.1 (H) 07/03/2022   Lab Results  Component Value Date   MICROALBUR 2.2 (H) 01/15/2023   LDLCALC 52 07/03/2022   CREATININE 0.85 01/15/2023    Poorly controlled with A1c of 9.2 (goal <7). Currently on Metformin and Trulicity. No changes in diet or exercise. Discussed the need for additional medication to achieve glycemic control and prevent long-term complications. -Check A1c today. -add Farxiga.

## 2023-04-20 ENCOUNTER — Telehealth: Payer: Self-pay | Admitting: Family

## 2023-04-20 NOTE — Telephone Encounter (Signed)
Please advise pt that his A1C is still above goal.  I sent an rx for Jardiance to his pharmacy to add to his Trulicity and metformin. Please also work on diet/exercise and weight loss.  Lab Results  Component Value Date   HGBA1C 8.7 (H) 04/16/2023   HGBA1C 9.2 (H) 01/15/2023   HGBA1C 10.4 (H) 10/04/2022   Lab Results  Component Value Date   MICROALBUR 2.2 (H) 01/15/2023   LDLCALC 74 04/16/2023   CREATININE 0.85 01/15/2023

## 2023-04-21 NOTE — Telephone Encounter (Signed)
Called patient but no answer, left voice mail for patient to call back.   Ok to Archivist

## 2023-04-22 NOTE — Telephone Encounter (Signed)
Patient notified of results, new prescription and provider's recomendations

## 2023-06-09 ENCOUNTER — Other Ambulatory Visit: Payer: Self-pay | Admitting: Family

## 2023-06-23 ENCOUNTER — Telehealth: Payer: Self-pay | Admitting: Family

## 2023-06-23 NOTE — Telephone Encounter (Signed)
 Called patient and offered him to come in today or tomorrow, he reports he is working and not able to come in. He reports he was having burning with urination but better today. He was advised to call us for appointment when he is back from work. He can also go to an urgent care where he is at.

## 2023-06-23 NOTE — Telephone Encounter (Signed)
 Pt's daughter Jon Gills) states pt is having blood in urine, started Saturday - pt has an appt with provider on Friday 06-27-2023, pt is thinking could be possible urine infection- pt wanted to know if he can get labs done before coming to appt (If possible to put lab orders in ). Please advise.

## 2023-06-25 ENCOUNTER — Ambulatory Visit: Admitting: Family

## 2023-06-25 ENCOUNTER — Encounter: Payer: Self-pay | Admitting: Family

## 2023-06-25 ENCOUNTER — Telehealth: Payer: Self-pay | Admitting: Family

## 2023-06-25 VITALS — BP 182/84 | HR 78 | Temp 98.7°F | Resp 16 | Ht 70.0 in | Wt 191.0 lb

## 2023-06-25 DIAGNOSIS — I10 Essential (primary) hypertension: Secondary | ICD-10-CM

## 2023-06-25 DIAGNOSIS — R319 Hematuria, unspecified: Secondary | ICD-10-CM | POA: Diagnosis not present

## 2023-06-25 LAB — POC URINALSYSI DIPSTICK (AUTOMATED)
Bilirubin, UA: NEGATIVE
Blood, UA: NEGATIVE
Glucose, UA: POSITIVE — AB
Ketones, UA: NEGATIVE
Leukocytes, UA: NEGATIVE
Nitrite, UA: NEGATIVE
Protein, UA: NEGATIVE
Spec Grav, UA: 1.015 (ref 1.010–1.025)
Urobilinogen, UA: 0.2 U/dL
pH, UA: 5 (ref 5.0–8.0)

## 2023-06-25 MED ORDER — AMLODIPINE BESYLATE 10 MG PO TABS: ORAL_TABLET | ORAL | 1 refills | Status: DC

## 2023-06-25 NOTE — Assessment & Plan Note (Signed)
  Currently on metoprolol zestoretic, metoprolol.  States that he ran out of amlodipine "months ago." And did not take medication today. Plan to restart amlodipine for better control. - Restart amlodipine. - Recheck blood pressure in one month.

## 2023-06-25 NOTE — Assessment & Plan Note (Signed)
 UA clear. Will send for micro and culture for further evaluation. I do think that the "blood" he was seeing was due to discoloration from AZO.

## 2023-06-25 NOTE — Progress Notes (Signed)
 Subjective:     Patient ID: Paul Yu, male    DOB: 21-Jan-1956, 68 y.o.   MRN: [id number]  Chief Complaint  Patient presents with   Hematuria    Patient complains of blood in the urine "a few days ago".    Hypertension    Patient reports he "ran out of amlodipine a few months ago"    HPI  Discussed the use of AI scribe software for clinical note transcription with the patient, who gave verbal consent to proceed.  History of Present Illness  Paul Yu is a 68 year old male who presents with burning urination.  He has been experiencing burning with urination for the past couple of weeks, with the sensation worsening at night. He describes a weaker urinary stream compared to when he was younger, with occasional dribbling at the end of urination. He has been taking Azo, which has caused his urine to appear discolored, sometimes reddish and sometimes orangish. No penile discharge or concerns for sexually transmitted infections. He feels that he empties his bladder completely.  He has a history of diabetes and had a diabetic eye exam at the end of last year, though the results have not been updated in his records.   He is currently taking metoprolol and occasionally forgets to take his medication, especially when he goes to work in the morning.     Health Maintenance Due  Topic Date Due   OPHTHALMOLOGY EXAM  04/25/2023    Past Medical History:  Diagnosis Date   Diabetes mellitus without complication [facility])     Past Surgical History:  Procedure Laterality Date   LEFT HEART CATHETERIZATION WITH CORONARY ANGIOGRAM N/A 07/15/2014   Procedure: LEFT HEART CATHETERIZATION WITH CORONARY ANGIOGRAM;  Surgeon: Tonny Bollman, MD;  Location: [facility] CATH LAB;  Service: Cardiovascular;  Laterality: N/A;   TONSILLECTOMY  1966    Family History  Problem Relation Age of Onset   Cancer Mother        ?lung   Diabetes Maternal Grandmother    Heart disease Neg Hx     Social History    Socioeconomic History   Marital status: Married    Spouse name: Not on file   Number of children: Not on file   Years of education: Not on file   Highest education level: Not on file  Occupational History   Not on file  Tobacco Use   Smoking status: Never   Smokeless tobacco: Never  Substance and Sexual Activity   Alcohol use: Yes    Alcohol/week: 0.0 standard drinks of alcohol    Comment: rarely,    Drug use: No   Sexual activity: Not on file  Other Topics Concern   Not on file  Social History Narrative   4th marriage-    Daughter born 2002   Son 1982-lives in [place]   Son 1985- in [place]   Drives a truck   Completed 9th grade- GED   Enjoys working on cars,     Social Drivers of Corporate investment banker Strain: Not on [employer] Insecurity: Not on file  Transportation Needs: Not on file  Physical Activity: Not on file  Stress: Not on file  Social Connections: Not on file  Intimate Partner Violence: Not on file    Outpatient Medications Prior to Visit  Medication Sig Dispense Refill   aspirin EC 81 MG tablet Take 1 tablet (81 mg total) by mouth daily.     Blood  Glucose Monitoring Suppl (ONETOUCH VERIO) w/Device KIT Test blood sugar once daily. Dx Code: E11.9 1 kit 0   cetirizine (ZYRTEC) 10 MG tablet Take 10 mg by mouth daily as needed for allergies.     cloNIDine (CATAPRES) 0.1 MG tablet Take 1 tablet (0.1 mg total) by mouth 3 (three) times daily. 270 tablet 0   Continuous Glucose Sensor (FREESTYLE LIBRE 3 SENSOR) MISC Apply one sensor every 14 days. 6 each 5   Dulaglutide (TRULICITY) 3 MG/0.5ML SOAJ INJECT 3 MG AS DIRECTED ONCE A WEEK 6 mL 1   empagliflozin (JARDIANCE) 10 MG TABS tablet Take 1 tablet (10 mg total) by mouth daily before breakfast. 30 tablet 2   lisinopril-hydrochlorothiazide (ZESTORETIC) 20-25 MG tablet Take 1 tablet by mouth daily. 90 tablet 1   metFORMIN (GLUCOPHAGE) 1000 MG tablet TAKE 1 TABLET TWICE A DAY WITH A MEAL 180 tablet 1    ONETOUCH DELICA LANCETS FINE MISC Reported on 05/04/2015  11   pravastatin (PRAVACHOL) 80 MG tablet TAKE 1 TABLET(80 MG) BY MOUTH DAILY 90 tablet 1   tadalafil (CIALIS) 10 MG tablet Take 1 tablet (10 mg total) by mouth daily as needed for erectile dysfunction. 10 tablet 5   amLODipine (NORVASC) 10 MG tablet TAKE 1 TABLET(10 MG) BY MOUTH DAILY 90 tablet 1   No facility-administered medications prior to visit.    Allergies  Allergen Reactions   Atorvastatin Other (See Comments)    Muscle aches    ROS See HPI    Objective:    Physical Exam Constitutional:      General: He is not in acute distress.    Appearance: He is well-developed.  HENT:     Head: Normocephalic and atraumatic.  Cardiovascular:     Rate and Rhythm: Normal rate and regular rhythm.     Heart sounds: No murmur heard. Pulmonary:     Effort: Pulmonary effort is normal. No respiratory distress.     Breath sounds: Normal breath sounds. No wheezing or rales.  Musculoskeletal:        General: No swelling.  Skin:    General: Skin is warm and dry.  Neurological:     Mental Status: He is alert and oriented to person, place, and time.  Psychiatric:        Behavior: Behavior normal.        Thought Content: Thought content normal.      BP (!) 182/84   Pulse 78   Temp 98.7 F (37.1 C) (Oral)   Resp 16   Ht 5\' 10"  (1.778 m)   Wt 191 lb (86.6 kg)   SpO2 98%   BMI 27.41 kg/m  Wt Readings from Last 3 Encounters:  06/25/23 191 lb (86.6 kg)  04/16/23 204 lb (92.5 kg)  01/15/23 207 lb (93.9 kg)       Assessment & Plan:   Problem List Items Addressed This Visit       Unprioritized   HTN (hypertension)    Currently on metoprolol zestoretic, metoprolol.  States that he ran out of amlodipine "months ago." And did not take medication today. Plan to restart amlodipine for better control. - Restart amlodipine. - Recheck blood pressure in one month.      Relevant Medications   amLODipine (NORVASC) 10 MG  tablet   Hematuria - Primary   UA clear. Will send for micro and culture for further evaluation. I do think that the "blood" he was seeing was due to discoloration from AZO.  Relevant Orders   POCT Urinalysis Dipstick (Automated) (Completed)   Urine Microscopic Only   Urine Culture    I am having Jola Baptist maintain his aspirin EC, OneTouch Delica Lancets Fine, cetirizine, OneTouch Verio, tadalafil, pravastatin, cloNIDine, FreeStyle Libre 3 Sensor, lisinopril-hydrochlorothiazide, Trulicity, empagliflozin, metFORMIN, and amLODipine.  Meds ordered this encounter  Medications   amLODipine (NORVASC) 10 MG tablet    Sig: TAKE 1 TABLET(10 MG) BY MOUTH DAILY    Dispense:  90 tablet    Refill:  1

## 2023-06-25 NOTE — Patient Instructions (Signed)
 VISIT SUMMARY:  You came in today because you have been experiencing burning during urination for the past couple of weeks, which gets worse at night. You also mentioned having a weaker urinary stream and occasional dribbling at the end of urination. We discussed your history of diabetes and hypertension, and reviewed your current medications.  YOUR PLAN:  -DYSURIA: Dysuria means painful or difficult urination. Your urinalysis did not show signs of infection or blood, but we will send a urine sample for further testing to rule out any possible infections.  -HYPERTENSION: Hypertension means high blood pressure. You are currently taking metoprolol, but you sometimes forget to take it. We will restart your amlodipine to help better control your blood pressure. Please remember to take your medications regularly and we will recheck your blood pressure in one month.  -DIABETES MELLITUS: Diabetes Mellitus is a condition where your blood sugar levels are too high. We performed a diabetic foot exam today and will follow up on your diabetic eye exam results from last year. We will recheck your diabetes management in one month.  INSTRUCTIONS:  Please follow up in one month to recheck your blood pressure and diabetes management. We will also review the results of your urine culture and diabetic eye exam at that time.

## 2023-06-25 NOTE — Assessment & Plan Note (Deleted)
 UA clear. Will send for micro and culture for further evaluation. I do think that the "blood" he was seeing was due to discoloration from AZO.

## 2023-06-25 NOTE — Telephone Encounter (Signed)
 Please request DM eye exam from [facility] on [address].

## 2023-06-25 NOTE — Progress Notes (Cosign Needed Addendum)
   Established Patient Office Visit  Subjective   Patient ID: Paul Yu, male    DOB: [birth year]  Age: 68 y.o. MRN: [id number]  Chief Complaint  Patient presents with   Hematuria    Patient complains of blood in the urine "a few days ago".    Hypertension    Patient reports he "ran out of amlodipine a few months ago"    Dysuria +blood (pink in toilet), thought he had kidney stones years ago sinc fri Bilateral hip pain  Has taken AZO.  Denies penile discharge No concern for STI.   Hematuria Associated symptoms include dysuria. Pertinent negatives include no abdominal pain, chills or fever. His past medical history is significant for hypertension.  Hypertension Pertinent negatives include no blurred vision, chest pain, palpitations or shortness of breath.      Review of Systems  Constitutional:  Negative for chills and fever.  HENT:  Positive for tinnitus.        Chronic for 30+ years  Eyes:  Negative for blurred vision and double vision.  Respiratory:  Negative for cough and shortness of breath.   Cardiovascular:  Negative for chest pain and palpitations.  Gastrointestinal:  Negative for abdominal pain and diarrhea.  Genitourinary:  Positive for dysuria and hematuria.  Musculoskeletal:  Negative for joint pain and myalgias.  Skin:  Negative for rash.  Neurological:  Negative for dizziness and weakness.  Endo/Heme/Allergies:  Does not bruise/bleed easily.  Psychiatric/Behavioral:  Negative for depression.       Objective:     Physical Exam      Assessment & Plan:   Problem List Items Addressed This Visit   None   No follow-ups on file.    Cristopher Peru, RN

## 2023-06-25 NOTE — Telephone Encounter (Signed)
 Electronic request made

## 2023-06-26 LAB — URINE CULTURE
MICRO NUMBER:: 16279695
Result:: NO GROWTH
SPECIMEN QUALITY:: ADEQUATE

## 2023-06-26 LAB — URINALYSIS, MICROSCOPIC ONLY

## 2023-06-27 ENCOUNTER — Ambulatory Visit: Admitting: Family

## 2023-06-27 ENCOUNTER — Encounter: Payer: Self-pay | Admitting: Family

## 2023-07-16 ENCOUNTER — Ambulatory Visit: Payer: BC Managed Care – PPO | Admitting: Family

## 2023-07-25 ENCOUNTER — Ambulatory Visit: Admitting: Family

## 2023-08-01 ENCOUNTER — Encounter (HOSPITAL_COMMUNITY): Payer: Self-pay

## 2023-08-08 ENCOUNTER — Ambulatory Visit: Admitting: Family

## 2023-08-22 ENCOUNTER — Ambulatory Visit: Admitting: Family

## 2023-09-19 ENCOUNTER — Ambulatory Visit: Admitting: Family

## 2023-09-19 VITALS — BP 131/69 | HR 79 | Temp 98.7°F | Resp 16 | Ht 70.0 in | Wt 194.0 lb

## 2023-09-19 DIAGNOSIS — Z7985 Long-term (current) use of injectable non-insulin antidiabetic drugs: Secondary | ICD-10-CM

## 2023-09-19 DIAGNOSIS — I10 Essential (primary) hypertension: Secondary | ICD-10-CM

## 2023-09-19 DIAGNOSIS — N529 Male erectile dysfunction, unspecified: Secondary | ICD-10-CM

## 2023-09-19 DIAGNOSIS — E1121 Type 2 diabetes mellitus with diabetic nephropathy: Secondary | ICD-10-CM | POA: Diagnosis not present

## 2023-09-19 DIAGNOSIS — E785 Hyperlipidemia, unspecified: Secondary | ICD-10-CM

## 2023-09-19 DIAGNOSIS — Z7984 Long term (current) use of oral hypoglycemic drugs: Secondary | ICD-10-CM

## 2023-09-19 LAB — BASIC METABOLIC PANEL WITH GFR
BUN: 14 mg/dL (ref 6–23)
CO2: 30 meq/L (ref 19–32)
Calcium: 9.3 mg/dL (ref 8.4–10.5)
Chloride: 103 meq/L (ref 96–112)
Creatinine, Ser: 0.95 mg/dL (ref 0.40–1.50)
GFR: 82.88 mL/min (ref >60.00)
Glucose, Bld: 178 mg/dL — ABNORMAL HIGH (ref 70–99)
Potassium: 4.5 meq/L (ref 3.5–5.1)
Sodium: 141 meq/L (ref 135–145)

## 2023-09-19 LAB — MICROALBUMIN / CREATININE URINE RATIO
Creatinine,U: 122.6 mg/dL
Microalb Creat Ratio: 13 mg/g (ref 0.0–30.0)
Microalb, Ur: 1.6 mg/dL (ref 0.0–1.9)

## 2023-09-19 LAB — HEMOGLOBIN A1C: Hgb A1c MFr Bld: 8.3 % — ABNORMAL HIGH (ref 4.6–6.5)

## 2023-09-19 MED ORDER — TRULICITY 3 MG/0.5ML ~~LOC~~ SOAJ: 3.0000 mg | SUBCUTANEOUS | Status: DC

## 2023-09-19 MED ORDER — LISINOPRIL-HYDROCHLOROTHIAZIDE 20-25 MG PO TABS: 1.0000 | ORAL_TABLET | Freq: Every day | ORAL | 1 refills | Status: DC

## 2023-09-19 NOTE — Assessment & Plan Note (Signed)
 BP Readings from Last 3 Encounters:  09/19/23 131/69  06/25/23 (!) 182/84  04/16/23 (!) 145/92   At goal, continue amlodipine , zestoretic , clonidine .

## 2023-09-19 NOTE — Patient Instructions (Addendum)
 VISIT SUMMARY:  You came in for a follow-up on your medications and lab results. Your blood pressure is well-controlled, but your diabetes management needs improvement. We also discussed your hyperlipidemia and general health maintenance.  YOUR PLAN:  DIABETES MELLITUS TYPE 2: Your blood sugar levels are not well-controlled, with an A1c of 8.7%. -It's important to monitor your blood glucose levels regularly. We will explore insurance options for a continuous glucose monitor. -Consider adjusting your medication if your A1c remains above 7%. -Please call to schedule your annual eye exam.  HYPERTENSION: Your blood pressure is well-controlled at 131/69 mmHg. -Continue taking your current blood pressure medications.  HYPERLIPIDEMIA: You have not been taking your pravastatin  for high cholesterol. -Please contact your pharmacy to refill your pravastatin  prescription.  GENERAL HEALTH MAINTENANCE: You are overdue for an eye exam, which is important for monitoring your diabetes. -Schedule an eye exam as soon as possible.

## 2023-09-19 NOTE — Assessment & Plan Note (Addendum)
 Lab Results  Component Value Date   HGBA1C 8.7 (H) 04/16/2023   HGBA1C 9.2 (H) 01/15/2023   HGBA1C 10.4 (H) 10/04/2022   Lab Results  Component Value Date   MICROALBUR 2.2 (H) 01/15/2023   LDLCALC 74 04/16/2023   CREATININE 0.85 01/15/2023   He has lost about 10 pounds since starting Trulicity . Will update A1C, was above goal last visit. Continue Trulicity  and metformin . Jardiance  was too expensive.

## 2023-09-19 NOTE — Assessment & Plan Note (Signed)
 Lab Results  Component Value Date   CHOL 143 04/16/2023   HDL 43.80 04/16/2023   LDLCALC 74 04/16/2023   LDLDIRECT 131.0 12/23/2014   TRIG 128.0 04/16/2023   CHOLHDL 3 04/16/2023   Last lipid panel stable.  He will refill and restart pravastatin .

## 2023-09-19 NOTE — Assessment & Plan Note (Signed)
 Not using cialis .

## 2023-09-19 NOTE — Progress Notes (Signed)
 Subjective:     Patient ID: [name], male    DOB: 05-Jan-1956, 68 y.o.   MRN: [id number]  Chief Complaint  Patient presents with   Diabetes    Here for follow up   Hypertension    Here for follow up    HPI  Discussed the use of AI scribe software for clinical note transcription with the patient, who gave verbal consent to proceed.  History of Present Illness   Paul Yu is a 68 year old male with hypertension and diabetes who presents for a follow-up on medications and lab results. He struggles to keep appointments due to work commitments. Hypertension is well-controlled with amlodipine , clonidine , and Zestoretic . He has not taken pravachol  for hyperlipidemia recently. His last A1c was 8.7 in January. He takes metformin  and Trulicity  but not Jardiance  due to cost. He does not regularly monitor blood glucose due to insurance issues with continuous glucose monitoring. His diet includes sugary foods, and he finds it difficult to prepare healthier options due to early work hours. He is active at work and finds it challenging to add more physical activity.     Health Maintenance Due  Topic Date Due   OPHTHALMOLOGY EXAM  04/25/2023    Past Medical History:  Diagnosis Date   Diabetes mellitus without complication [facility])     Past Surgical History:  Procedure Laterality Date   LEFT HEART CATHETERIZATION WITH CORONARY ANGIOGRAM N/A 07/15/2014   Procedure: LEFT HEART CATHETERIZATION WITH CORONARY ANGIOGRAM;  Surgeon: Ozell Fell, MD;  Location: [facility] CATH LAB;  Service: Cardiovascular;  Laterality: N/A;   TONSILLECTOMY  1966    Family History  Problem Relation Age of Onset   Cancer Mother        ?lung   Diabetes Maternal Grandmother    Heart disease Neg Hx     Social History   Socioeconomic History   Marital status: Married    Spouse name: Not on file   Number of children: Not on file   Years of education: Not on file   Highest education level: Not on file   Occupational History   Not on file  Tobacco Use   Smoking status: Never   Smokeless tobacco: Never  Substance and Sexual Activity   Alcohol use: Yes    Alcohol/week: 0.0 standard drinks of alcohol    Comment: rarely,    Drug use: No   Sexual activity: Not on file  Other Topics Concern   Not on file  Social History Narrative   4th marriage-    Daughter born 2002   Son 1982-lives in [place]   Son 1985- in [place]   Drives a truck   Completed 9th grade- GED   Enjoys working on cars,     Social Drivers of Corporate investment banker Strain: Not on Ship broker Insecurity: Not on file  Transportation Needs: Not on file  Physical Activity: Not on file  Stress: Not on file  Social Connections: Not on file  Intimate Partner Violence: Not on file    Outpatient Medications Prior to Visit  Medication Sig Dispense Refill   amLODipine  (NORVASC ) 10 MG tablet TAKE 1 TABLET(10 MG) BY MOUTH DAILY 90 tablet 1   aspirin  EC 81 MG tablet Take 1 tablet (81 mg total) by mouth daily.     Blood Glucose Monitoring Suppl (ONETOUCH VERIO) w/Device KIT Test blood sugar once daily. Dx Code: E11.9 1 kit 0   cetirizine (ZYRTEC) 10 MG  tablet Take 10 mg by mouth daily as needed for allergies.     cloNIDine  (CATAPRES ) 0.1 MG tablet Take 1 tablet (0.1 mg total) by mouth 3 (three) times daily. 270 tablet 0   Continuous Glucose Sensor (FREESTYLE LIBRE 3 SENSOR) MISC Apply one sensor every 14 days. 6 each 5   metFORMIN  (GLUCOPHAGE ) 1000 MG tablet TAKE 1 TABLET TWICE A DAY WITH A MEAL 180 tablet 1   ONETOUCH DELICA LANCETS FINE MISC Reported on 05/04/2015  11   pravastatin  (PRAVACHOL ) 80 MG tablet TAKE 1 TABLET(80 MG) BY MOUTH DAILY 90 tablet 1   empagliflozin  (JARDIANCE ) 10 MG TABS tablet Take 1 tablet (10 mg total) by mouth daily before breakfast. 30 tablet 2   lisinopril -hydrochlorothiazide  (ZESTORETIC ) 20-25 MG tablet Take 1 tablet by mouth daily. 90 tablet 1   tadalafil  (CIALIS ) 10 MG tablet Take 1  tablet (10 mg total) by mouth daily as needed for erectile dysfunction. 10 tablet 5   No facility-administered medications prior to visit.    Allergies  Allergen Reactions   Atorvastatin  Other (See Comments)    Muscle aches    ROS See HPI    Objective:    Physical Exam Constitutional:      General: He is not in acute distress.    Appearance: He is well-developed.  HENT:     Head: Normocephalic and atraumatic.   Cardiovascular:     Rate and Rhythm: Normal rate and regular rhythm.     Heart sounds: No murmur heard. Pulmonary:     Effort: Pulmonary effort is normal. No respiratory distress.     Breath sounds: Normal breath sounds. No wheezing or rales.   Skin:    General: Skin is warm and dry.   Neurological:     Mental Status: He is alert and oriented to person, place, and time.   Psychiatric:        Behavior: Behavior normal.        Thought Content: Thought content normal.      BP 131/69 (BP Location: Right Arm, Patient Position: Sitting, Cuff Size: Small)   Pulse 79   Temp 98.7 F (37.1 C) (Oral)   Resp 16   Ht 5' 10 (1.778 m)   Wt 194 lb (88 kg)   SpO2 98%   BMI 27.84 kg/m  Wt Readings from Last 3 Encounters:  09/19/23 194 lb (88 kg)  06/25/23 191 lb (86.6 kg)  04/16/23 204 lb (92.5 kg)       Assessment & Plan:   Problem List Items Addressed This Visit       Unprioritized   Hyperlipidemia   Lab Results  Component Value Date   CHOL 143 04/16/2023   HDL 43.80 04/16/2023   LDLCALC 74 04/16/2023   LDLDIRECT 131.0 12/23/2014   TRIG 128.0 04/16/2023   CHOLHDL 3 04/16/2023   Last lipid panel stable.  He will refill and restart pravastatin .       Relevant Medications   lisinopril -hydrochlorothiazide  (ZESTORETIC ) 20-25 MG tablet   HTN (hypertension) - Primary   BP Readings from Last 3 Encounters:  09/19/23 131/69  06/25/23 (!) 182/84  04/16/23 (!) 145/92   At goal, continue amlodipine , zestoretic , clonidine .       Relevant Medications    lisinopril -hydrochlorothiazide  (ZESTORETIC ) 20-25 MG tablet   Erectile dysfunction   Not using cialis .        DM2 (diabetes mellitus, type 2) (HCC)   Lab Results  Component Value Date   HGBA1C 8.7 (H)  04/16/2023   HGBA1C 9.2 (H) 01/15/2023   HGBA1C 10.4 (H) 10/04/2022   Lab Results  Component Value Date   MICROALBUR 2.2 (H) 01/15/2023   LDLCALC 74 04/16/2023   CREATININE 0.85 01/15/2023   He has lost about 10 pounds since starting Trulicity . Will update A1C, was above goal last visit. Continue Trulicity  and metformin . Jardiance  was too expensive.      Relevant Medications   lisinopril -hydrochlorothiazide  (ZESTORETIC ) 20-25 MG tablet   Dulaglutide  (TRULICITY ) 3 MG/0.5ML SOAJ   Other Relevant Orders   HgB A1c   Basic Metabolic Panel (BMET)   Urine Microalbumin w/creat. ratio    I have discontinued Zachary SAUNDERS. Julius's tadalafil  and empagliflozin . I am also having him start on Trulicity . Additionally, I am having him maintain his aspirin  EC, OneTouch Delica Lancets Fine, cetirizine, OneTouch Verio, pravastatin , cloNIDine , FreeStyle Libre 3 Sensor, metFORMIN , amLODipine , and lisinopril -hydrochlorothiazide .  Meds ordered this encounter  Medications   lisinopril -hydrochlorothiazide  (ZESTORETIC ) 20-25 MG tablet    Sig: Take 1 tablet by mouth daily.    Dispense:  90 tablet    Refill:  1   Dulaglutide  (TRULICITY ) 3 MG/0.5ML SOAJ    Sig: Inject 3 mg as directed once a week.    Supervising Provider:   DOMENICA BLACKBIRD A [phone]

## 2023-09-21 ENCOUNTER — Telehealth: Payer: Self-pay | Admitting: Family

## 2023-09-21 DIAGNOSIS — E1121 Type 2 diabetes mellitus with diabetic nephropathy: Secondary | ICD-10-CM

## 2023-09-21 MED ORDER — TRULICITY 4.5 MG/0.5ML ~~LOC~~ SOAJ: 4.5000 mg | SUBCUTANEOUS | 3 refills | Status: DC

## 2023-09-21 NOTE — Telephone Encounter (Signed)
 Please advise pt that sugar has improved a little, but still above goal. I would like for him to increase his trulicity  dose from 3mg  to 4.5 mg weekly. Continue to work on [employer] and exercise.

## 2023-09-23 ENCOUNTER — Other Ambulatory Visit: Payer: Self-pay

## 2023-09-23 DIAGNOSIS — E1121 Type 2 diabetes mellitus with diabetic nephropathy: Secondary | ICD-10-CM

## 2023-09-23 MED ORDER — TRULICITY 4.5 MG/0.5ML ~~LOC~~ SOAJ: 4.5000 mg | SUBCUTANEOUS | 3 refills | Status: DC

## 2023-09-23 NOTE — Telephone Encounter (Signed)
 Patient notified of results, provider's comments and dose increase

## 2023-12-04 ENCOUNTER — Other Ambulatory Visit: Payer: Self-pay | Admitting: Family

## 2023-12-19 ENCOUNTER — Ambulatory Visit: Admitting: Family

## 2024-01-02 ENCOUNTER — Ambulatory Visit: Admitting: Family

## 2024-01-23 ENCOUNTER — Ambulatory Visit: Admitting: Family

## 2024-01-23 VITALS — BP 132/70 | HR 62 | Temp 98.0°F | Resp 18 | Ht 70.0 in | Wt 200.0 lb

## 2024-01-23 DIAGNOSIS — I10 Essential (primary) hypertension: Secondary | ICD-10-CM

## 2024-01-23 DIAGNOSIS — Z7985 Long-term (current) use of injectable non-insulin antidiabetic drugs: Secondary | ICD-10-CM

## 2024-01-23 DIAGNOSIS — E1121 Type 2 diabetes mellitus with diabetic nephropathy: Secondary | ICD-10-CM

## 2024-01-23 DIAGNOSIS — E785 Hyperlipidemia, unspecified: Secondary | ICD-10-CM | POA: Diagnosis not present

## 2024-01-23 DIAGNOSIS — Z7984 Long term (current) use of oral hypoglycemic drugs: Secondary | ICD-10-CM | POA: Diagnosis not present

## 2024-01-23 LAB — COMPREHENSIVE METABOLIC PANEL WITH GFR
ALT: 27 U/L (ref 0–53)
AST: 17 U/L (ref 0–37)
Albumin: 4.4 g/dL (ref 3.5–5.2)
Alkaline Phosphatase: 126 U/L — ABNORMAL HIGH (ref 39–117)
BUN: 19 mg/dL (ref 6–23)
CO2: 29 meq/L (ref 19–32)
Calcium: 9.1 mg/dL (ref 8.4–10.5)
Chloride: 98 meq/L (ref 96–112)
Creatinine, Ser: 0.96 mg/dL (ref 0.40–1.50)
GFR: 81.65 mL/min
Glucose, Bld: 218 mg/dL — ABNORMAL HIGH (ref 70–99)
Potassium: 4.1 meq/L (ref 3.5–5.1)
Sodium: 135 meq/L (ref 135–145)
Total Bilirubin: 0.7 mg/dL (ref 0.2–1.2)
Total Protein: 6.7 g/dL (ref 6.0–8.3)

## 2024-01-23 LAB — MICROALBUMIN / CREATININE URINE RATIO
Creatinine,U: 123 mg/dL
Microalb Creat Ratio: 14.1 mg/g (ref 0.0–30.0)
Microalb, Ur: 1.7 mg/dL (ref 0.0–1.9)

## 2024-01-23 MED ORDER — CLONIDINE HCL 0.1 MG PO TABS: 0.1000 mg | ORAL_TABLET | Freq: Three times a day (TID) | ORAL | 1 refills | Status: AC

## 2024-01-23 MED ORDER — TRULICITY 4.5 MG/0.5ML ~~LOC~~ SOAJ: 4.5000 mg | SUBCUTANEOUS | 1 refills | Status: AC

## 2024-01-23 MED ORDER — LISINOPRIL-HYDROCHLOROTHIAZIDE 20-25 MG PO TABS: 1.0000 | ORAL_TABLET | Freq: Every day | ORAL | 1 refills | Status: AC

## 2024-01-23 MED ORDER — PRAVASTATIN SODIUM 80 MG PO TABS
80.0000 mg | ORAL_TABLET | Freq: Every day | ORAL | 1 refills | Status: AC
Start: 2024-01-23 — End: ?

## 2024-01-23 MED ORDER — AMLODIPINE BESYLATE 10 MG PO TABS: ORAL_TABLET | ORAL | 1 refills | Status: AC

## 2024-01-23 NOTE — Assessment & Plan Note (Signed)
 Lab Results  Component Value Date   CHOL 143 04/16/2023   HDL 43.80 04/16/2023   LDLCALC 74 04/16/2023   LDLDIRECT 131.0 12/23/2014   TRIG 128.0 04/16/2023   CHOLHDL 3 04/16/2023   Lipids stable on pravastatin .  Continue same.

## 2024-01-23 NOTE — Progress Notes (Signed)
 Subjective:     Patient ID: Paul Yu, male    DOB: 11-08-1955, 68 y.o.   MRN: [id number]  Chief Complaint  Patient presents with   Follow-up    3 month    HPI  Discussed the use of AI scribe software for clinical note transcription with the patient, who gave verbal consent to proceed.  History of Present Illness   Paul Yu is a 68 year old male with hypertension and diabetes who presents for medication refills and diabetes management.  He takes amlodipine , clonidine , and Zestril  for hypertension, adhering to the prescribed regimen. For diabetes, he uses Trulicity  and metformin . His A1c has improved to 8.3% from previous values of 9.2% and 8.7%. He had a two-week interruption in Trulicity  due to a delay in receiving the medication but has resumed it. He struggles with reducing sweets, describing a craving similar to an addiction, and enjoys Production Designer, Theatre/television/film Dr. Nunzio. His last eye exam was over eight months ago, and he has been reminded by his wife to schedule an appointment.    Health Maintenance Due  Topic Date Due   Medicare Annual Wellness (AWV)  Never done   OPHTHALMOLOGY EXAM  04/25/2023    Past Medical History:  Diagnosis Date   Diabetes mellitus without complication [facility])     Past Surgical History:  Procedure Laterality Date   LEFT HEART CATHETERIZATION WITH CORONARY ANGIOGRAM N/A 07/15/2014   Procedure: LEFT HEART CATHETERIZATION WITH CORONARY ANGIOGRAM;  Surgeon: Ozell Fell, MD;  Location: [facility] CATH LAB;  Service: Cardiovascular;  Laterality: N/A;   TONSILLECTOMY  1966    Family History  Problem Relation Age of Onset   Cancer Mother        ?lung   Diabetes Maternal Grandmother    Heart disease Neg Hx     Social History   Socioeconomic History   Marital status: Married    Spouse name: Not on file   Number of children: Not on file   Years of education: Not on file   Highest education level: Not on file  Occupational History   Not on file   Tobacco Use   Smoking status: Never   Smokeless tobacco: Never  Substance and Sexual Activity   Alcohol use: Yes    Alcohol/week: 0.0 standard drinks of alcohol    Comment: rarely,    Drug use: No   Sexual activity: Not on file  Other Topics Concern   Not on file  Social History Narrative   4th marriage-    Daughter born 2002   Son 1982-lives in [place]   Son 1985- in [place]   Drives a truck   Completed 9th grade- GED   Enjoys working on cars,     Social Drivers of Corporate Investment Banker Strain: Not on Ship Broker Insecurity: Not on file  Transportation Needs: Not on file  Physical Activity: Not on file  Stress: Not on file  Social Connections: Not on file  Intimate Partner Violence: Not on file    Outpatient Medications Prior to Visit  Medication Sig Dispense Refill   aspirin  EC 81 MG tablet Take 1 tablet (81 mg total) by mouth daily.     Blood Glucose Monitoring Suppl (ONETOUCH VERIO) w/Device KIT Test blood sugar once daily. Dx Code: E11.9 1 kit 0   cetirizine (ZYRTEC) 10 MG tablet Take 10 mg by mouth daily as needed for allergies.     Continuous Glucose Sensor (FREESTYLE LIBRE 3 SENSOR) MISC  Apply one sensor every 14 days. 6 each 5   metFORMIN  (GLUCOPHAGE ) 1000 MG tablet TAKE 1 TABLET TWICE A DAY WITH A MEAL 180 tablet 3   ONETOUCH DELICA LANCETS FINE MISC Reported on 05/04/2015  11   amLODipine  (NORVASC ) 10 MG tablet TAKE 1 TABLET(10 MG) BY MOUTH DAILY 90 tablet 1   cloNIDine  (CATAPRES ) 0.1 MG tablet Take 1 tablet (0.1 mg total) by mouth 3 (three) times daily. 270 tablet 0   Dulaglutide  (TRULICITY ) 4.5 MG/0.5ML SOAJ Inject 4.5 mg into the skin once a week. 2 mL 3   lisinopril -hydrochlorothiazide  (ZESTORETIC ) 20-25 MG tablet Take 1 tablet by mouth daily. 90 tablet 1   pravastatin  (PRAVACHOL ) 80 MG tablet TAKE 1 TABLET(80 MG) BY MOUTH DAILY 90 tablet 1   No facility-administered medications prior to visit.    Allergies  Allergen Reactions   Atorvastatin   Other (See Comments)    Muscle aches    ROS    See HPI Objective:    Physical Exam Constitutional:      General: He is not in acute distress.    Appearance: He is well-developed.  HENT:     Head: Normocephalic and atraumatic.  Cardiovascular:     Rate and Rhythm: Normal rate and regular rhythm.     Heart sounds: No murmur heard. Pulmonary:     Effort: Pulmonary effort is normal. No respiratory distress.     Breath sounds: Normal breath sounds. No wheezing or rales.  Skin:    General: Skin is warm and dry.  Neurological:     Mental Status: He is alert and oriented to person, place, and time.  Psychiatric:        Behavior: Behavior normal.        Thought Content: Thought content normal.      BP 132/70   Pulse 62   Temp 98 F (36.7 C)   Resp 18   Ht 5' 10 (1.778 m)   Wt 200 lb (90.7 kg)   SpO2 98%   BMI 28.70 kg/m  Wt Readings from Last 3 Encounters:  01/23/24 200 lb (90.7 kg)  09/19/23 194 lb (88 kg)  06/25/23 191 lb (86.6 kg)       Assessment & Plan:   Problem List Items Addressed This Visit       Unprioritized   Hyperlipidemia   Lab Results  Component Value Date   CHOL 143 04/16/2023   HDL 43.80 04/16/2023   LDLCALC 74 04/16/2023   LDLDIRECT 131.0 12/23/2014   TRIG 128.0 04/16/2023   CHOLHDL 3 04/16/2023   Lipids stable on pravastatin .  Continue same.        Relevant Medications   lisinopril -hydrochlorothiazide  (ZESTORETIC ) 20-25 MG tablet   amLODipine  (NORVASC ) 10 MG tablet   cloNIDine  (CATAPRES ) 0.1 MG tablet   pravastatin  (PRAVACHOL ) 80 MG tablet   HTN (hypertension) - Primary   BP stable on clonidine , amlodipine , zestoretic . Continue same.       Relevant Medications   lisinopril -hydrochlorothiazide  (ZESTORETIC ) 20-25 MG tablet   amLODipine  (NORVASC ) 10 MG tablet   cloNIDine  (CATAPRES ) 0.1 MG tablet   pravastatin  (PRAVACHOL ) 80 MG tablet   DM2 (diabetes mellitus, type 2) (HCC)   Lab Results  Component Value Date   HGBA1C 8.3  (H) 09/19/2023   HGBA1C 8.7 (H) 04/16/2023   HGBA1C 9.2 (H) 01/15/2023   Lab Results  Component Value Date   MICROALBUR 1.6 09/19/2023   LDLCALC 74 04/16/2023   CREATININE 0.95 09/19/2023   Counseled on  diet.  Continues trulicity  and metformin .       Relevant Medications   lisinopril -hydrochlorothiazide  (ZESTORETIC ) 20-25 MG tablet   pravastatin  (PRAVACHOL ) 80 MG tablet   Dulaglutide  (TRULICITY ) 4.5 MG/0.5ML SOAJ   Other Relevant Orders   Hemoglobin A1c   Comp Met (CMET)   Urine Microalbumin w/creat. ratio    I have changed Zachary SAUNDERS. Clinard's pravastatin . I am also having him maintain his aspirin  EC, OneTouch Delica Lancets Fine, cetirizine, OneTouch Verio, FreeStyle Libre 3 Sensor, metFORMIN , lisinopril -hydrochlorothiazide , amLODipine , cloNIDine , and Trulicity .  Meds ordered this encounter  Medications   lisinopril -hydrochlorothiazide  (ZESTORETIC ) 20-25 MG tablet    Sig: Take 1 tablet by mouth daily.    Dispense:  90 tablet    Refill:  1    Supervising Provider:   DOMENICA BLACKBIRD A [[id number]]   amLODipine  (NORVASC ) 10 MG tablet    Sig: TAKE 1 TABLET(10 MG) BY MOUTH DAILY    Dispense:  90 tablet    Refill:  1    Supervising Provider:   DOMENICA BLACKBIRD A [[id number]]   cloNIDine  (CATAPRES ) 0.1 MG tablet    Sig: Take 1 tablet (0.1 mg total) by mouth 3 (three) times daily.    Dispense:  270 tablet    Refill:  1    Supervising Provider:   DOMENICA BLACKBIRD A [[id number]]   pravastatin  (PRAVACHOL ) 80 MG tablet    Sig: Take 1 tablet (80 mg total) by mouth daily.    Dispense:  90 tablet    Refill:  1    Supervising Provider:   DOMENICA BLACKBIRD A [[id number]]   Dulaglutide  (TRULICITY ) 4.5 MG/0.5ML SOAJ    Sig: Inject 4.5 mg into the skin once a week.    Dispense:  6 mL    Refill:  1    Supervising Provider:   DOMENICA BLACKBIRD A [[id number]]

## 2024-01-23 NOTE — Assessment & Plan Note (Signed)
 Lab Results  Component Value Date   HGBA1C 8.3 (H) 09/19/2023   HGBA1C 8.7 (H) 04/16/2023   HGBA1C 9.2 (H) 01/15/2023   Lab Results  Component Value Date   MICROALBUR 1.6 09/19/2023   LDLCALC 74 04/16/2023   CREATININE 0.95 09/19/2023   Counseled on diet.  Continues trulicity  and metformin .

## 2024-01-23 NOTE — Patient Instructions (Signed)
 VISIT SUMMARY:  During your visit, we discussed your diabetes and hypertension management. Your A1c has improved but is still above the target. We also talked about your difficulty with sugar cravings and the importance of scheduling an eye exam.  YOUR PLAN:  TYPE 2 DIABETES MELLITUS: Your A1c has improved to 8.3% but is still above the target. You mentioned having difficulty with sugar cravings and had a recent lapse in taking Trulicity  due to a delivery delay. -We have sent your Trulicity  prescription to [facility] for a 61-month supply. -We have also sent your metformin  prescription to [facility]. -Lab tests have been ordered to monitor your condition. -Please schedule a follow-up appointment in 3 months.  HYPERTENSION: Your blood pressure is well controlled at 132/70 mmHg with your current medications. -We have sent refills for amlodipine , clonidine , and Zestril  to [facility] on [address].  GENERAL HEALTH MAINTENANCE: You declined the flu vaccination and your eye exam is overdue by eight months, which is important for managing your diabetes. -Please schedule an eye exam as soon as possible.

## 2024-01-23 NOTE — Assessment & Plan Note (Signed)
 BP stable on clonidine , amlodipine , zestoretic . Continue same.

## 2024-01-24 LAB — HEMOGLOBIN A1C
Hgb A1c MFr Bld: 8.4 % — ABNORMAL HIGH (ref <5.7)
Mean Plasma Glucose: 194 mg/dL
eAG (mmol/L): 10.8 mmol/L

## 2024-01-26 ENCOUNTER — Ambulatory Visit: Payer: Self-pay | Admitting: Family

## 2024-01-26 DIAGNOSIS — E1121 Type 2 diabetes mellitus with diabetic nephropathy: Secondary | ICD-10-CM

## 2024-01-26 NOTE — Progress Notes (Signed)
 Left vm for pt to call office

## 2024-01-26 NOTE — Telephone Encounter (Signed)
 Please advise pt that his sugar remains uncontrolled.  I would like to add Lantus insulin  10 units sq daily.

## 2024-02-02 NOTE — Telephone Encounter (Signed)
 Patient declines addition of insulin .

## 2024-04-30 ENCOUNTER — Ambulatory Visit: Admitting: Family

## 2024-06-11 ENCOUNTER — Ambulatory Visit: Admitting: Family
